# Patient Record
Sex: Female | Born: 1955 | Race: White | Hispanic: No | Marital: Married | Smoking: Never smoker
Health system: Southern US, Community
[De-identification: ages and names within clinical notes are randomized; demographics above are authoritative.]

## PROBLEM LIST (undated history)

## (undated) DIAGNOSIS — F32A Depression, unspecified: Secondary | ICD-10-CM

## (undated) DIAGNOSIS — C801 Malignant (primary) neoplasm, unspecified: Secondary | ICD-10-CM

## (undated) DIAGNOSIS — F329 Major depressive disorder, single episode, unspecified: Secondary | ICD-10-CM

## (undated) DIAGNOSIS — C4490 Unspecified malignant neoplasm of skin, unspecified: Secondary | ICD-10-CM

## (undated) HISTORY — DX: Depression, unspecified: F32.A

## (undated) HISTORY — DX: Major depressive disorder, single episode, unspecified: F32.9

## (undated) HISTORY — DX: Malignant (primary) neoplasm, unspecified: C80.1

## (undated) HISTORY — PX: APPENDECTOMY: SHX54

## (undated) HISTORY — DX: Unspecified malignant neoplasm of skin, unspecified: C44.90

---

## 1999-04-30 ENCOUNTER — Other Ambulatory Visit: Admission: RE | Admit: 1999-04-30 | Discharge: 1999-04-30 | Payer: Self-pay | Admitting: Obstetrics & Gynecology

## 1999-05-01 ENCOUNTER — Other Ambulatory Visit: Admission: RE | Admit: 1999-05-01 | Discharge: 1999-05-01 | Payer: Self-pay | Admitting: Obstetrics & Gynecology

## 2000-06-09 ENCOUNTER — Other Ambulatory Visit: Admission: RE | Admit: 2000-06-09 | Discharge: 2000-06-09 | Payer: Self-pay | Admitting: Obstetrics & Gynecology

## 2001-04-11 ENCOUNTER — Encounter: Admission: RE | Admit: 2001-04-11 | Discharge: 2001-04-11 | Payer: Self-pay | Admitting: Family Medicine

## 2001-04-11 ENCOUNTER — Encounter: Payer: Self-pay | Admitting: Family Medicine

## 2002-09-13 ENCOUNTER — Other Ambulatory Visit: Admission: RE | Admit: 2002-09-13 | Discharge: 2002-09-13 | Payer: Self-pay | Admitting: Obstetrics & Gynecology

## 2004-01-10 ENCOUNTER — Ambulatory Visit: Payer: Self-pay | Admitting: Family Medicine

## 2004-03-06 ENCOUNTER — Ambulatory Visit: Payer: Self-pay | Admitting: Family Medicine

## 2004-03-06 ENCOUNTER — Other Ambulatory Visit: Admission: RE | Admit: 2004-03-06 | Discharge: 2004-03-06 | Payer: Self-pay | Admitting: Family Medicine

## 2004-03-27 ENCOUNTER — Ambulatory Visit: Payer: Self-pay | Admitting: Family Medicine

## 2004-06-29 LAB — HM COLONOSCOPY: HM Colonoscopy: NORMAL

## 2004-07-15 ENCOUNTER — Ambulatory Visit: Payer: Self-pay | Admitting: Family Medicine

## 2005-07-20 ENCOUNTER — Ambulatory Visit: Payer: Self-pay | Admitting: Family Medicine

## 2005-07-24 ENCOUNTER — Ambulatory Visit: Payer: Self-pay | Admitting: Family Medicine

## 2005-12-22 ENCOUNTER — Ambulatory Visit: Payer: Self-pay | Admitting: Family Medicine

## 2008-05-11 ENCOUNTER — Ambulatory Visit: Payer: Self-pay | Admitting: Internal Medicine

## 2010-04-01 LAB — HM PAP SMEAR: HM Pap smear: NORMAL

## 2010-10-27 ENCOUNTER — Ambulatory Visit (INDEPENDENT_AMBULATORY_CARE_PROVIDER_SITE_OTHER): Payer: BC Managed Care – PPO | Admitting: Internal Medicine

## 2010-10-27 ENCOUNTER — Encounter: Payer: Self-pay | Admitting: Internal Medicine

## 2010-10-27 DIAGNOSIS — F32A Depression, unspecified: Secondary | ICD-10-CM | POA: Insufficient documentation

## 2010-10-27 DIAGNOSIS — L255 Unspecified contact dermatitis due to plants, except food: Secondary | ICD-10-CM

## 2010-10-27 DIAGNOSIS — M810 Age-related osteoporosis without current pathological fracture: Secondary | ICD-10-CM | POA: Insufficient documentation

## 2010-10-27 DIAGNOSIS — Z23 Encounter for immunization: Secondary | ICD-10-CM

## 2010-10-27 DIAGNOSIS — N390 Urinary tract infection, site not specified: Secondary | ICD-10-CM

## 2010-10-27 DIAGNOSIS — L237 Allergic contact dermatitis due to plants, except food: Secondary | ICD-10-CM

## 2010-10-27 DIAGNOSIS — J329 Chronic sinusitis, unspecified: Secondary | ICD-10-CM

## 2010-10-27 DIAGNOSIS — F329 Major depressive disorder, single episode, unspecified: Secondary | ICD-10-CM | POA: Insufficient documentation

## 2010-10-27 MED ORDER — CIPROFLOXACIN HCL 250 MG PO TABS: ORAL_TABLET | ORAL | Status: DC

## 2010-10-27 MED ORDER — AZITHROMYCIN 500 MG PO TABS: ORAL_TABLET | ORAL | Status: DC

## 2010-10-27 MED ORDER — METHYLPREDNISOLONE ACETATE 40 MG/ML IJ SUSP
40.0000 mg | Freq: Once | INTRAMUSCULAR | Status: AC
  Administered 2010-10-27: 40 mg via INTRAMUSCULAR

## 2010-10-27 MED ORDER — HYDROXYZINE PAMOATE 25 MG PO CAPS: 25.0000 mg | ORAL_CAPSULE | Freq: Three times a day (TID) | ORAL | Status: AC | PRN

## 2010-10-27 MED ORDER — PREDNISONE (PAK) 10 MG PO TABS: ORAL_TABLET | ORAL | Status: AC

## 2010-10-27 NOTE — Progress Notes (Signed)
  Subjective:    Patient ID: Joy Edwards, female    DOB: 1955-09-16, 55 y.o.   MRN: [id number]  HPI  55 yo wf presents with diffuse erythematous pruritic rash covering arms and legs after coming into contact with poison ivy  This weekend while clearing her mother's garden.  No fevers, chills, or open draining lesions, but intensely pruritic and spreading.   No past medical history on file. No current outpatient prescriptions on file prior to visit.   No current facility-administered medications on file prior to visit.    Review of Systems  Constitutional: Negative for fever, chills and unexpected weight change.  HENT: Negative for hearing loss, ear pain, nosebleeds, congestion, sore throat, facial swelling, rhinorrhea, sneezing, mouth sores, trouble swallowing, neck pain, neck stiffness, voice change, postnasal drip, sinus pressure, tinnitus and ear discharge.   Eyes: Negative for pain, discharge, redness and visual disturbance.  Respiratory: Negative for cough, chest tightness, shortness of breath, wheezing and stridor.   Cardiovascular: Negative for chest pain, palpitations and leg swelling.  Musculoskeletal: Negative for myalgias and arthralgias.  Skin: Positive for rash. Negative for color change.  Neurological: Negative for dizziness, weakness, light-headedness and headaches.  Hematological: Negative for adenopathy.       Objective:   Physical Exam  Constitutional: She is oriented to person, place, and time. She appears well-developed and well-nourished.  HENT:  Mouth/Throat: Oropharynx is clear and moist.  Eyes: EOM are normal. Pupils are equal, round, and reactive to light. No scleral icterus.  Neck: Normal range of motion. Neck supple. No JVD present. No thyromegaly present.  Cardiovascular: Normal rate, regular rhythm, normal heart sounds and intact distal pulses.   Pulmonary/Chest: Effort normal and breath sounds normal.  Abdominal: Soft. Bowel sounds are normal. She  exhibits no mass. There is no tenderness.  Musculoskeletal: Normal range of motion. She exhibits no edema.  Lymphadenopathy:    She has no cervical adenopathy.  Neurological: She is alert and oriented to person, place, and time.  Skin: Skin is warm and dry. Rash noted.  Psychiatric: She has a normal mood and affect.          Assessment & Plan:  Contact dermatitis: secondary to poison ivy/sumac exposure this weekend. Hiistory of moderate to severe systemic reactions in the past.  IM Depo medrol given and steroid taper.   Influenza prophylaxis: she has no prior history of annual vaccinations, but she is travelling to [place] in 3 weeks and I have recommended vaccination.  Prophylaxis for traveler's diarrhea and CAP:  I have given her prescriptions for ciprofloxacin and azithromycin to take with her to [place] .

## 2011-11-09 ENCOUNTER — Encounter: Payer: Self-pay | Admitting: Internal Medicine

## 2012-04-27 ENCOUNTER — Telehealth: Payer: Self-pay | Admitting: Internal Medicine

## 2012-04-27 DIAGNOSIS — M810 Age-related osteoporosis without current pathological fracture: Secondary | ICD-10-CM

## 2012-04-27 NOTE — Telephone Encounter (Signed)
Bone density scheduled 3/31, they need the order Fx [phone]

## 2012-04-27 NOTE — Telephone Encounter (Signed)
Ordered.  To be done at [facility], I presume

## 2012-04-29 LAB — HM DEXA SCAN

## 2012-05-16 ENCOUNTER — Encounter: Payer: BC Managed Care – PPO | Admitting: Internal Medicine

## 2012-06-01 ENCOUNTER — Encounter: Payer: Self-pay | Admitting: Internal Medicine

## 2012-06-29 ENCOUNTER — Other Ambulatory Visit (HOSPITAL_COMMUNITY)
Admission: RE | Admit: 2012-06-29 | Discharge: 2012-06-29 | Disposition: A | Discharge status: Home / Self Care | Payer: BC Managed Care – PPO | Source: Ambulatory Visit | Attending: Internal Medicine | Admitting: Internal Medicine

## 2012-06-29 ENCOUNTER — Encounter: Payer: Self-pay | Admitting: Internal Medicine

## 2012-06-29 ENCOUNTER — Ambulatory Visit (INDEPENDENT_AMBULATORY_CARE_PROVIDER_SITE_OTHER): Payer: BC Managed Care – PPO | Admitting: Internal Medicine

## 2012-06-29 VITALS — BP 108/72 | HR 71 | Temp 98.2°F | Resp 14 | Ht 64.0 in | Wt 118.8 lb

## 2012-06-29 DIAGNOSIS — F329 Major depressive disorder, single episode, unspecified: Secondary | ICD-10-CM

## 2012-06-29 DIAGNOSIS — Z01419 Encounter for gynecological examination (general) (routine) without abnormal findings: Secondary | ICD-10-CM | POA: Insufficient documentation

## 2012-06-29 DIAGNOSIS — F489 Nonpsychotic mental disorder, unspecified: Secondary | ICD-10-CM

## 2012-06-29 DIAGNOSIS — Z1151 Encounter for screening for human papillomavirus (HPV): Secondary | ICD-10-CM | POA: Insufficient documentation

## 2012-06-29 DIAGNOSIS — F341 Dysthymic disorder: Secondary | ICD-10-CM

## 2012-06-29 DIAGNOSIS — F418 Other specified anxiety disorders: Secondary | ICD-10-CM

## 2012-06-29 DIAGNOSIS — Z124 Encounter for screening for malignant neoplasm of cervix: Secondary | ICD-10-CM

## 2012-06-29 DIAGNOSIS — F5105 Insomnia due to other mental disorder: Secondary | ICD-10-CM

## 2012-06-29 DIAGNOSIS — F32A Depression, unspecified: Secondary | ICD-10-CM

## 2012-06-29 DIAGNOSIS — Z1239 Encounter for other screening for malignant neoplasm of breast: Secondary | ICD-10-CM

## 2012-06-29 DIAGNOSIS — Z0000 Encounter for general adult medical examination without abnormal findings: Secondary | ICD-10-CM

## 2012-06-29 MED ORDER — ZOLPIDEM TARTRATE 5 MG PO TABS: 5.0000 mg | ORAL_TABLET | Freq: Every evening | ORAL | Status: DC | PRN

## 2012-06-29 NOTE — Patient Instructions (Addendum)
We will investigate for causes of bruising including vitamin K deficiency. Sand vitamin D   Do the take home stool test annual for colon Ca screening   Request a 3 d mammogram with next mammogram .   I have given you samples of Premarin vaginal cream..  Apply intravaginally every night for two weeks,  Then reduce applications to twice weekly.  Use  1  Gram per application   For your insomnia,  Trial of low dose generic ambien .  Start with 1/2 tablet ,  Repeat dose in 30 minutes if no results. Use no more than 3 times per week to avoid dependency

## 2012-06-29 NOTE — Progress Notes (Signed)
Patient ID: Joy Edwards, female   DOB: [birth year], 57 y.o.   MRN: [id number]      Subjective:     Joy Edwards is a 57 y.o. female and is here for a comprehensive physical exam. The patient reports problems - with chronic insomnia .  History   Social History  . Marital Status: Married    Spouse Name: N/A    Number of Children: N/A  . Years of Education: N/A   Occupational History  . Not on file.   Social History Main Topics  . Smoking status: Never Smoker   . Smokeless tobacco: Never Used  . Alcohol Use: Yes     Comment: wine  . Drug Use: No  . Sexually Active: Not on file   Other Topics Concern  . Not on file   Social History Narrative  . No narrative on file   Health Maintenance  Topic Date Due  . Tetanus/tdap  06/27/1972  . Mammogram  06/28/2003  . Influenza Vaccine  10/03/2012  . Colonoscopy  06/30/2014  . Pap Smear  06/30/2015    The following portions of the patient's history were reviewed and updated as appropriate: allergies, current medications, past family history, past medical history, past social history, past surgical history and problem list.  Review of Systems A comprehensive review of systems was negative.   Objective:   BP 108/72  Pulse 71  Temp(Src) 98.2 F (36.8 C) (Oral)  Resp 14  Ht 5\' 4"  (1.626 m)  Wt 118 lb 12 oz (53.865 kg)  BMI 20.37 kg/m2  SpO2 99%  General Appearance:    Alert, cooperative, no distress, appears stated age  Head:    Normocephalic, without obvious abnormality, atraumatic  Eyes:    PERRL, conjunctiva/corneas clear, EOM's intact, fundi    benign, both eyes  Ears:    Normal TM's and external ear canals, both ears  Nose:   Nares normal, septum midline, mucosa normal, no drainage    or sinus tenderness  Throat:   Lips, mucosa, and tongue normal; teeth and gums normal  Neck:   Supple, symmetrical, trachea midline, no adenopathy;    thyroid:  no enlargement/tenderness/nodules; no carotid   bruit or JVD   Back:     Symmetric, no curvature, ROM normal, no CVA tenderness  Lungs:     Clear to auscultation bilaterally, respirations unlabored  Chest Wall:    No tenderness or deformity   Heart:    Regular rate and rhythm, S1 and S2 normal, no murmur, rub   or gallop  Breast Exam:    No tenderness, masses, or nipple abnormality  Abdomen:     Soft, non-tender, bowel sounds active all four quadrants,    no masses, no organomegaly  Genitalia:    Pelvic: cervix normal in appearance, external genitalia normal, no adnexal masses or tenderness, no cervical motion tenderness, rectovaginal septum normal, uterus normal size, shape, and consistency and vagina normal without discharge  Extremities:   Extremities normal, atraumatic, no cyanosis or edema  Pulses:   2+ and symmetric all extremities  Skin:   Skin color, texture, turgor normal, no rashes or lesions  Lymph nodes:   Cervical, supraclavicular, and axillary nodes normal  Neurologic:   CNII-XII intact, normal strength, sensation and reflexes    throughout     Assessment:    Routine general medical examination at a health care facility Annual comprehensive exam was done including breast, pelvic and PAP smear. All screenings have been  addressed .   Depression With insomnia persistent despite use of wellbutrin and sertraline   Insomnia secondary to depression with anxiety Discussed trial of low dose ambien not to be used more than 3 times weekly   Updated Medication List Outpatient Encounter Prescriptions as of 06/29/2012  Medication Sig Dispense Refill  . buPROPion (WELLBUTRIN XL) 300 MG 24 hr tablet Take 300 mg by mouth daily.        . sertraline (ZOLOFT) 25 MG tablet Take 25 mg by mouth daily.        Marland Kitchen azithromycin (ZITHROMAX) 500 MG tablet Take 1 tablet daily for 7 days for sinusitis or pneumonia  7 tablet  0  . ciprofloxacin (CIPRO) 250 MG tablet 1 tablet twice daily for UTI or diarrhea  14 tablet  7  . zolpidem (AMBIEN) 5 MG tablet Take 1  tablet (5 mg total) by mouth at bedtime as needed for sleep.  15 tablet  1  . [DISCONTINUED] zolpidem (AMBIEN) 5 MG tablet Take 1 tablet (5 mg total) by mouth at bedtime as needed for sleep.  15 tablet  1   No facility-administered encounter medications on file as of 06/29/2012.

## 2012-06-30 ENCOUNTER — Encounter: Payer: Self-pay | Admitting: Internal Medicine

## 2012-06-30 DIAGNOSIS — F418 Other specified anxiety disorders: Secondary | ICD-10-CM | POA: Insufficient documentation

## 2012-06-30 DIAGNOSIS — Z0000 Encounter for general adult medical examination without abnormal findings: Secondary | ICD-10-CM | POA: Insufficient documentation

## 2012-06-30 NOTE — Assessment & Plan Note (Signed)
Discussed trial of low dose ambien not to be used more than 3 times weekly

## 2012-06-30 NOTE — Assessment & Plan Note (Addendum)
With insomnia persistent despite use of wellbutrin and sertraline

## 2012-06-30 NOTE — Assessment & Plan Note (Signed)
Annual comprehensive exam was done including breast, pelvic and PAP smear. All screenings have been addressed .  

## 2012-07-05 ENCOUNTER — Encounter: Payer: Self-pay | Admitting: *Deleted

## 2012-07-13 ENCOUNTER — Telehealth: Payer: Self-pay | Admitting: *Deleted

## 2012-07-13 DIAGNOSIS — E785 Hyperlipidemia, unspecified: Secondary | ICD-10-CM

## 2012-07-13 DIAGNOSIS — R5381 Other malaise: Secondary | ICD-10-CM

## 2012-07-13 DIAGNOSIS — E559 Vitamin D deficiency, unspecified: Secondary | ICD-10-CM

## 2012-07-13 NOTE — Telephone Encounter (Signed)
Pt is coming in for labs Friday 06.13.2014 what labs and dx?  

## 2012-07-15 ENCOUNTER — Encounter: Payer: Self-pay | Admitting: *Deleted

## 2012-07-15 ENCOUNTER — Other Ambulatory Visit (INDEPENDENT_AMBULATORY_CARE_PROVIDER_SITE_OTHER): Payer: BC Managed Care – PPO

## 2012-07-15 DIAGNOSIS — R5381 Other malaise: Secondary | ICD-10-CM

## 2012-07-15 DIAGNOSIS — E785 Hyperlipidemia, unspecified: Secondary | ICD-10-CM

## 2012-07-15 DIAGNOSIS — E559 Vitamin D deficiency, unspecified: Secondary | ICD-10-CM

## 2012-07-15 LAB — COMPREHENSIVE METABOLIC PANEL
ALT: 26 U/L (ref 0–35)
AST: 22 U/L (ref 0–37)
Albumin: 4 g/dL (ref 3.5–5.2)
Alkaline Phosphatase: 52 U/L (ref 39–117)
BUN: 15 mg/dL (ref 6–23)
CO2: 27 mEq/L (ref 19–32)
Calcium: 9.2 mg/dL (ref 8.4–10.5)
Chloride: 104 mEq/L (ref 96–112)
Creatinine, Ser: 0.8 mg/dL (ref 0.4–1.2)
GFR: 74.77 mL/min (ref >60.00)
Glucose, Bld: 88 mg/dL (ref 70–99)
Potassium: 4.3 mEq/L (ref 3.5–5.1)
Sodium: 139 mEq/L (ref 135–145)
Total Bilirubin: 0.7 mg/dL (ref 0.3–1.2)
Total Protein: 6.7 g/dL (ref 6.0–8.3)

## 2012-07-15 LAB — CBC WITH DIFFERENTIAL/PLATELET
Basophils Absolute: 0 10*3/uL (ref 0.0–0.1)
Basophils Relative: 1 % (ref 0.0–3.0)
Eosinophils Absolute: 0.1 10*3/uL (ref 0.0–0.7)
Eosinophils Relative: 3 % (ref 0.0–5.0)
HCT: 39.7 % (ref 36.0–46.0)
Hemoglobin: 13.6 g/dL (ref 12.0–15.0)
Lymphocytes Relative: 32.8 % (ref 12.0–46.0)
Lymphs Abs: 1.2 10*3/uL (ref 0.7–4.0)
MCHC: 34.3 g/dL (ref 30.0–36.0)
MCV: 91.1 fl (ref 78.0–100.0)
Monocytes Absolute: 0.4 10*3/uL (ref 0.1–1.0)
Monocytes Relative: 11.2 % (ref 3.0–12.0)
Neutro Abs: 1.9 10*3/uL (ref 1.4–7.7)
Neutrophils Relative %: 52 % (ref 43.0–77.0)
Platelets: 202 10*3/uL (ref 150.0–400.0)
RBC: 4.36 Mil/uL (ref 3.87–5.11)
RDW: 12.6 % (ref 11.5–14.6)
WBC: 3.7 10*3/uL — ABNORMAL LOW (ref 4.5–10.5)

## 2012-07-15 LAB — LIPID PANEL
Cholesterol: 183 mg/dL (ref 0–200)
HDL: 81.6 mg/dL (ref >39.00)
LDL Cholesterol: 92 mg/dL (ref 0–99)
Total CHOL/HDL Ratio: 2
Triglycerides: 48 mg/dL (ref 0.0–149.0)
VLDL: 9.6 mg/dL (ref 0.0–40.0)

## 2012-07-15 LAB — TSH: TSH: 2.68 u[IU]/mL (ref 0.35–5.50)

## 2012-07-16 LAB — VITAMIN D 25 HYDROXY (VIT D DEFICIENCY, FRACTURES): Vit D, 25-Hydroxy: 48 ng/mL (ref 30–89)

## 2012-08-19 ENCOUNTER — Other Ambulatory Visit: Payer: Self-pay | Admitting: Chiropractic Medicine

## 2012-08-19 ENCOUNTER — Ambulatory Visit
Admission: RE | Admit: 2012-08-19 | Discharge: 2012-08-19 | Disposition: A | Discharge status: Home / Self Care | Payer: BC Managed Care – PPO | Source: Ambulatory Visit | Attending: Chiropractic Medicine | Admitting: Chiropractic Medicine

## 2012-08-19 DIAGNOSIS — M545 Low back pain, unspecified: Secondary | ICD-10-CM

## 2012-08-19 DIAGNOSIS — M542 Cervicalgia: Secondary | ICD-10-CM

## 2012-08-23 ENCOUNTER — Other Ambulatory Visit: Payer: Self-pay | Admitting: Physician Assistant

## 2012-08-23 ENCOUNTER — Ambulatory Visit
Admission: RE | Admit: 2012-08-23 | Discharge: 2012-08-23 | Disposition: A | Discharge status: Home / Self Care | Payer: BC Managed Care – PPO | Source: Ambulatory Visit | Attending: Physician Assistant | Admitting: Physician Assistant

## 2012-08-23 DIAGNOSIS — M953 Acquired deformity of neck: Secondary | ICD-10-CM

## 2012-08-23 DIAGNOSIS — IMO0002 Reserved for concepts with insufficient information to code with codable children: Secondary | ICD-10-CM

## 2012-08-23 DIAGNOSIS — M4310 Spondylolisthesis, site unspecified: Secondary | ICD-10-CM

## 2012-08-26 ENCOUNTER — Telehealth: Payer: Self-pay | Admitting: *Deleted

## 2012-08-26 NOTE — Telephone Encounter (Signed)
Order for DG Cerv spine & flex ext ordered under wrong Tereso Newcomer, PAC. I s/w with Elease Hashimoto @ [facility] who placed the order and asked for them to please correct order remving Jocelyn Lamer, PA and place under [place] D. Saralyn Pilar.  ok thank you

## 2012-12-08 ENCOUNTER — Other Ambulatory Visit: Payer: Self-pay

## 2012-12-15 ENCOUNTER — Ambulatory Visit (INDEPENDENT_AMBULATORY_CARE_PROVIDER_SITE_OTHER): Payer: BC Managed Care – PPO | Admitting: Adult Health

## 2012-12-15 ENCOUNTER — Encounter: Payer: Self-pay | Admitting: Adult Health

## 2012-12-15 VITALS — BP 122/76 | HR 125 | Temp 97.6°F | Resp 16 | Wt 121.0 lb

## 2012-12-15 DIAGNOSIS — J0100 Acute maxillary sinusitis, unspecified: Secondary | ICD-10-CM | POA: Insufficient documentation

## 2012-12-15 DIAGNOSIS — J329 Chronic sinusitis, unspecified: Secondary | ICD-10-CM

## 2012-12-15 MED ORDER — AZITHROMYCIN 250 MG PO TABS: ORAL_TABLET | ORAL | Status: DC

## 2012-12-15 MED ORDER — FLUTICASONE PROPIONATE 50 MCG/ACT NA SUSP: 2.0000 | Freq: Every day | NASAL | Status: DC

## 2012-12-15 NOTE — Progress Notes (Signed)
  Subjective:    Patient ID: Joy Edwards, female    DOB: 08/31/1955, 57 y.o.   MRN: [id number]  HPI Patient is a pleasant 57 year old female who presents to clinic with sinus congestion, pressure, bilateral ear pressure, stuffy head x3 days. She reports green secretions. Postnasal drip. No cough, shortness of breath. Afebrile   Current Outpatient Prescriptions on File Prior to Visit  Medication Sig Dispense Refill  . buPROPion (WELLBUTRIN XL) 300 MG 24 hr tablet Take 300 mg by mouth daily.        . sertraline (ZOLOFT) 25 MG tablet Take 25 mg by mouth daily.        . ciprofloxacin (CIPRO) 250 MG tablet 1 tablet twice daily for UTI or diarrhea  14 tablet  7  . zolpidem (AMBIEN) 5 MG tablet Take 1 tablet (5 mg total) by mouth at bedtime as needed for sleep.  15 tablet  1   No current facility-administered medications on file prior to visit.    Review of Systems  Constitutional: Negative for fever and chills.  HENT: Positive for congestion, postnasal drip, rhinorrhea and sinus pressure. Negative for sore throat.        Objective:   Physical Exam  Constitutional: She is oriented to person, place, and time. She appears well-developed and well-nourished.  HENT:  Head: Normocephalic and atraumatic.  Right Ear: External ear normal.  Left Ear: External ear normal.  Thick drainage posterior pharynx. Erythema of throat.  Cardiovascular: Normal rate and regular rhythm.   Pulmonary/Chest: Effort normal. No respiratory distress. She has no wheezes. She has no rales.  Neurological: She is alert and oriented to person, place, and time.  Psychiatric: She has a normal mood and affect. Her behavior is normal. Judgment and thought content normal.          Assessment & Plan:

## 2012-12-15 NOTE — Assessment & Plan Note (Signed)
Start azithromycin. Flonase nasal spray 2 sprays in each nostril daily. Irrigate with saline. Drink plenty of fluids.

## 2012-12-15 NOTE — Patient Instructions (Signed)

## 2012-12-15 NOTE — Progress Notes (Signed)
Pre visit review using our clinic review tool, if applicable. No additional management support is needed unless otherwise documented below in the visit note. 

## 2013-03-17 ENCOUNTER — Other Ambulatory Visit: Payer: Self-pay | Admitting: Internal Medicine

## 2013-03-17 ENCOUNTER — Telehealth: Payer: Self-pay | Admitting: Internal Medicine

## 2013-03-17 DIAGNOSIS — H6690 Otitis media, unspecified, unspecified ear: Secondary | ICD-10-CM

## 2013-03-17 MED ORDER — AMOXICILLIN-POT CLAVULANATE 875-125 MG PO TABS: 1.0000 | ORAL_TABLET | Freq: Two times a day (BID) | ORAL | Status: DC

## 2013-03-17 NOTE — Telephone Encounter (Signed)
Patient to go to urgent care.

## 2013-03-17 NOTE — Telephone Encounter (Signed)
Pt left vm needing appt for ?ear infection.  Returned pt call.  No appt available at this office.  Offered pt appt at [facility].  Pt willing, but could not make appt at [facility] due to schedule conflict.  Please call pt with any openings this afternoon if possible.  Pt will go to urgent care if she has to.

## 2013-03-17 NOTE — Assessment & Plan Note (Signed)
Augmentin, sudafed PE and probiotics advised

## 2013-06-06 ENCOUNTER — Other Ambulatory Visit: Payer: Self-pay | Admitting: Chiropractic Medicine

## 2013-06-06 DIAGNOSIS — M4310 Spondylolisthesis, site unspecified: Secondary | ICD-10-CM

## 2013-06-06 DIAGNOSIS — IMO0002 Reserved for concepts with insufficient information to code with codable children: Secondary | ICD-10-CM

## 2013-06-06 DIAGNOSIS — M953 Acquired deformity of neck: Secondary | ICD-10-CM

## 2013-11-17 ENCOUNTER — Other Ambulatory Visit: Payer: Self-pay

## 2013-12-15 ENCOUNTER — Ambulatory Visit (INDEPENDENT_AMBULATORY_CARE_PROVIDER_SITE_OTHER): Payer: BC Managed Care – PPO | Admitting: Podiatry

## 2013-12-15 ENCOUNTER — Encounter: Payer: Self-pay | Admitting: Podiatry

## 2013-12-15 ENCOUNTER — Ambulatory Visit (INDEPENDENT_AMBULATORY_CARE_PROVIDER_SITE_OTHER): Payer: BC Managed Care – PPO

## 2013-12-15 DIAGNOSIS — M79674 Pain in right toe(s): Secondary | ICD-10-CM

## 2013-12-15 DIAGNOSIS — S92911A Unspecified fracture of right toe(s), initial encounter for closed fracture: Secondary | ICD-10-CM

## 2013-12-15 NOTE — Patient Instructions (Signed)

## 2013-12-15 NOTE — Progress Notes (Signed)
   Subjective:    Patient ID: Joy Edwards, female    DOB: 31-Dec-1955, 58 y.o.   MRN: [id number]  HPI 79-year-old female presents the operative complaints of right fourth toe pain. She states that she injured approximately one month ago and since then she's had continued pain and swelling and bruising over the digit. At the time of injury for which she states she stubbed her toe, she did not have any treatment. She states that she has pain particularly with weightbearing certain shoe gear. Denies any other injury or areas of pain. No other complaints at this time.   Review of Systems  HENT: Positive for tinnitus.   Endocrine: Positive for cold intolerance.  Hematological: Bruises/bleeds easily.  All other systems reviewed and are negative.      Objective:   Physical Exam Objective: AAO x3, NAD DP/PT pulses palpable bilaterally, CRT less than 3 seconds Protective sensation intact with Simms Weinstein monofilament, vibratory sensation intact, Achilles tendon reflex intact Tenderness palpation of the right fourth digit. There is mild edema overlying the digit with mild ecchymosis. There is mild pain with range of motion of the PIPJ and MTPJ. There is no pain along the course of the metatarsal and no pain with vibratory sensation along the metatarsal. There is no edema overlying the forefoot. There is no other areas of pinpoint pain or pain with vibratory sensation. MMT 5/5, ROM WNL except for otherwise mentioned. No calf pain with compression, swelling, warmth, erythema.  No open lesions/preulcerative lesions       Assessment & Plan:  58 year old female right fourth toe fracture. -X-rays were obtained and reviewed with the patient. -Conservative versus surgical treatment discussed including alternatives, risks, complications. -At this time due to continued pain into the digit and pain with range of motion of MTPJ and fracture identified on x-ray well placed patient into a surgical shoe  to help take the pressure off the area and to help prevent bending of the toe.  -Ice to the area. -Follow-up in 3 weeks or sooner if any problems are to arise. At that time repeat x-rays will be obtained. In the meantime, call the office in the questions, concerns, change in symptoms. Follow-up with PCP for other issues mentioned in the ROS.

## 2014-01-05 ENCOUNTER — Ambulatory Visit (INDEPENDENT_AMBULATORY_CARE_PROVIDER_SITE_OTHER): Payer: BC Managed Care – PPO | Admitting: Podiatry

## 2014-01-05 ENCOUNTER — Ambulatory Visit (INDEPENDENT_AMBULATORY_CARE_PROVIDER_SITE_OTHER): Payer: BC Managed Care – PPO

## 2014-01-05 ENCOUNTER — Encounter: Payer: Self-pay | Admitting: Podiatry

## 2014-01-05 VITALS — BP 118/67 | HR 62 | Resp 11

## 2014-01-05 DIAGNOSIS — M79671 Pain in right foot: Secondary | ICD-10-CM

## 2014-01-05 DIAGNOSIS — S92911D Unspecified fracture of right toe(s), subsequent encounter for fracture with routine healing: Secondary | ICD-10-CM

## 2014-01-09 NOTE — Progress Notes (Signed)
Patient ID: NDEA KILROY, female   DOB: Jun 29, 1955, 58 y.o.   MRN: [id number]  Subjective: 58 year old female returns the office they for follow-up evaluation of right fourth digit fracture. She states that since last appointment she has continued to have discomfort the digit as well as swelling. She has been wearing the surgical shoe. She denies any acute changes since last appointment. Denies any systemic complaints such as fevers, chills, nausea, vomiting. No other complaints at this time.  Objective: AAO 3, NAD DP/PT pulses palpable bilaterally, CRT less than 3 seconds Protective sensation intact with Simms Weinstein monofilament, vibratory sensation intact, Achilles tendon reflex intact. There is tenderness to palpation overlying the central aspect of the right fourth digit. There is overlying edema to the fourth digit. There is no pain with range of motion of MTPJ or along the course of the metatarsals. Mild discomfort with ROM of the right 4th PIPJ/DIPJ. There is no open lesions. No overlying erythema or increase in warmth. No other areas of pinpoint bony tenderness or pain with vibratory sensation. Overall, MMT 5/5, ROM WNL except otherwise stated. No pain with calf compression, swelling, warmth, erythema. No open lesions or pre-ulcerative lesions.  Assessment: 58 year old female right fourth digit fracture  Plan: -X-rays were obtained and reviewed the patient. There still appears to be radiolucency consistent with fracture in the fourth digit. Patient has been immobilized now for 4 weeks. -Continue wearing surgical shoe. Discussed with the patient and showed her how to splint the fourth digit to the third digit and to help control swelling. -Continue ice to the area. -Follow-up in 3 weeks or sooner if any problems are to arise. In the meantime, call the office with any questions, concerns, change in symptoms.

## 2014-01-31 ENCOUNTER — Ambulatory Visit (INDEPENDENT_AMBULATORY_CARE_PROVIDER_SITE_OTHER): Payer: BC Managed Care – PPO | Admitting: Podiatry

## 2014-01-31 ENCOUNTER — Encounter: Payer: Self-pay | Admitting: Podiatry

## 2014-01-31 ENCOUNTER — Ambulatory Visit (INDEPENDENT_AMBULATORY_CARE_PROVIDER_SITE_OTHER): Payer: BC Managed Care – PPO

## 2014-01-31 VITALS — HR 73 | Resp 18

## 2014-01-31 DIAGNOSIS — R52 Pain, unspecified: Secondary | ICD-10-CM

## 2014-01-31 DIAGNOSIS — S92911D Unspecified fracture of right toe(s), subsequent encounter for fracture with routine healing: Secondary | ICD-10-CM

## 2014-02-02 NOTE — Progress Notes (Signed)
Patient ID: Joy Edwards, female   DOB: 1955-02-08, 59 y.o.   MRN: [id number]  Subjective: 59 year old female returns the office for follow up evaluation of right fourth digit fracture. She states that she's been continuing with the surgical shoe. She states that the area is that is painful as it was prior however she states that when she is on it more she has pain and swelling to the toe. She denies any recent injury or trauma to the area. No other complaints at this time in no acute changes since last appointment. Denies any systemic complaints such as fevers, chills, nausea, vomiting.  Objective: AAO x3, NAD DP/PT pulses palpable bilaterally, CRT less than 3 seconds Protective sensation intact with Simms Weinstein monofilament, vibratory sensation intact, Achilles tendon reflex intact Tenderness palpation overlying the right fourth digit. There is mild edema around the digit. There is no other areas of pinpoint bony tenderness or pain with vibratory sensation to bilateral lower extremity's. Edema is limited to the digit and does not extend past the level of the MPJ. There is no associated erythema or increase in warmth. MTPJ range of motion within normal limits and without discomfort. MMT 5/5, ROM WNL No open lesions or pre-ulcerative lesions. No pain with calf compression, swelling, warmth, erythema.  Assessment: 59 year old female follow up evaluation right fourth digital fracture.  Plan: -X-rays were obtained and reviewed with the patient. There continues to be radiolucent line within the fourth digit. -Treatment options were discussed including alternatives, risks, competitions. -Discussed with the patient that although the fracture is not healed on x-ray she can start to transition to a regular sneaker as tolerated as her symptoms decrease. Continue to buddy tape the digits together for stabilization and to help control swelling. If she continues to have pain/swelling to the digits or  any increase when wearing a sneaker, to go back into the surgical shoe.  -Follow-up in 4 weeks or sooner should any problems arise. In the meantime, encouraged to call with any questions/concerns/change in symptoms.

## 2014-02-12 ENCOUNTER — Telehealth: Payer: Self-pay | Admitting: Internal Medicine

## 2014-02-12 NOTE — Telephone Encounter (Signed)
Spoke with pt, she states she was at work when symptoms started.  She is on the way home now, and she states she is going to lay down for a while.  If she still does not feel better she will have someone take her to the ER.

## 2014-02-12 NOTE — Telephone Encounter (Signed)
[facility] called and stated patient having symptoms of syncope and generalized weakness refusing ER wanted to be seen. Spoke with patient and advised MD out of office and no appointment available and that with these symptoms she should follow advice and be seen in ER. Patient stated she was in car and almost home and would have mother drive her to ER. FYI

## 2014-02-12 NOTE — Telephone Encounter (Signed)
Agree with ER eval.  Should not drive if syncope.  Can f/u after ER visit.

## 2014-02-12 NOTE — Telephone Encounter (Signed)
Pt aware. Verbalized understanding.  

## 2014-02-12 NOTE — Telephone Encounter (Signed)
[facility]  Patient Name: Joy Edwards  DOB: [birth year]    Nurse Assessment  Nurse: Loletta Specter, RN, Wells Guiles Date/Time Eilene Ghazi Time): 02/12/2014 2:19:01 PM  Confirm and document reason for call. If symptomatic, describe symptoms. ---Caller states she got really dizzy today. Still feels "not right."  Has the patient traveled out of the country within the last 30 days? ---Not Applicable  Does the patient require triage? ---Yes  Related visit to physician within the last 2 weeks? ---No  Does the PT have any chronic conditions? (i.e. diabetes, asthma, etc.) ---No     Guidelines    Guideline Title Affirmed Question Affirmed Notes  Dizziness - Lightheadedness Patient sounds very sick or weak to the triager    Final Disposition User   Go to ED Now (or PCP triage) Loletta Specter, RN, Wells Guiles

## 2014-02-12 NOTE — Telephone Encounter (Signed)
Agree with plan. If not feeling better, or if other symptoms such as chest pain, dyspnea, then needs to be evaluated in the ED asap.

## 2014-02-13 NOTE — Telephone Encounter (Signed)
Tried to call patient and verify if patient went to the ER there was no answer left message for patient to return call.

## 2014-02-13 NOTE — Telephone Encounter (Signed)
Patient did not go to ER as advised and now this morning is vomiting, dizziness, chills, sweats, and generalized malaise advised patient should still be evaluated no appointments available in office advised patient she should be seen either urgent care or ER. Patient stated she will GO to walk-in  Clinic or urgent care.

## 2014-02-28 ENCOUNTER — Ambulatory Visit (INDEPENDENT_AMBULATORY_CARE_PROVIDER_SITE_OTHER): Payer: BLUE CROSS/BLUE SHIELD

## 2014-02-28 ENCOUNTER — Ambulatory Visit (INDEPENDENT_AMBULATORY_CARE_PROVIDER_SITE_OTHER): Payer: BLUE CROSS/BLUE SHIELD | Admitting: Podiatry

## 2014-02-28 ENCOUNTER — Encounter: Payer: Self-pay | Admitting: Podiatry

## 2014-02-28 VITALS — BP 121/72 | HR 68 | Resp 10

## 2014-02-28 DIAGNOSIS — M79674 Pain in right toe(s): Secondary | ICD-10-CM

## 2014-02-28 DIAGNOSIS — S92911D Unspecified fracture of right toe(s), subsequent encounter for fracture with routine healing: Secondary | ICD-10-CM

## 2014-03-01 NOTE — Progress Notes (Signed)
Patient ID: NEILANI DUFFEE, female   DOB: Dec 22, 1955, 59 y.o.   MRN: [id number]  Subjective: 59 year old female returns the office for follow up evaluation of right fourth digital fracture. Since last appointment she states that she has stopped wearing a surgical shoe and she is wearing regular shoes. She presents today in a boot with a high heel. She states that she is also gone back to exercising. She states that her pain has somewhat decreased although after activity she does continue to have pain to the right fourth toe. No other complaints at this time. No acute changes since last appointment.  Objective: AAO x3, NAD DP/PT pulses palpable bilaterally, CRT less than 3 seconds Protective sensation intact with Simms Weinstein monofilament, vibratory sensation intact, Achilles tendon reflex intact Mild tenderness to palpation overlying the right fourth digit. There is mild edema around the digit however does appear to be decreased compared to prior appointment. There is no specific area pinpoint bony tenderness at this time and the pain does seem to be decreased compared to prior appointments. There is no pain with MTPJ range of motion is no pain along the metatarsals. There is no other areas of edema, erythema, increase in warmth lateral lower extremities.  No open lesions or pre-ulcerative lesions. No pain with calf compression, swelling, warmth, erythema.  Assessment: 59 year old female father vibration right fourth digital fracture  Plan: -X-rays were obtained and reviewed the patient. There does appear to be increased consolidation across the fracture site.  -Treatment options were discussed including alternatives, risks, complications. -Recommended the patient to at least wear a sneaker or supportive-type shoe which would avoid putting too much pressure on the toe. Also discussed tocontinue to tape the toe as needed. -Follow-up in 4 weeks or sooner should any problems arise. In the meantime  occurs call the office with any questions, concerns, change in symptoms.

## 2014-03-27 ENCOUNTER — Ambulatory Visit (INDEPENDENT_AMBULATORY_CARE_PROVIDER_SITE_OTHER): Payer: BLUE CROSS/BLUE SHIELD | Admitting: Internal Medicine

## 2014-03-27 ENCOUNTER — Encounter: Payer: Self-pay | Admitting: Internal Medicine

## 2014-03-27 VITALS — BP 100/62 | HR 71 | Temp 97.9°F | Resp 14 | Ht 65.0 in | Wt 122.5 lb

## 2014-03-27 DIAGNOSIS — M810 Age-related osteoporosis without current pathological fracture: Secondary | ICD-10-CM

## 2014-03-27 DIAGNOSIS — R5383 Other fatigue: Secondary | ICD-10-CM

## 2014-03-27 DIAGNOSIS — Z0000 Encounter for general adult medical examination without abnormal findings: Secondary | ICD-10-CM

## 2014-03-27 DIAGNOSIS — E785 Hyperlipidemia, unspecified: Secondary | ICD-10-CM

## 2014-03-27 DIAGNOSIS — Z1239 Encounter for other screening for malignant neoplasm of breast: Secondary | ICD-10-CM

## 2014-03-27 DIAGNOSIS — Z1159 Encounter for screening for other viral diseases: Secondary | ICD-10-CM

## 2014-03-27 DIAGNOSIS — E559 Vitamin D deficiency, unspecified: Secondary | ICD-10-CM

## 2014-03-27 DIAGNOSIS — Z1211 Encounter for screening for malignant neoplasm of colon: Secondary | ICD-10-CM

## 2014-03-27 DIAGNOSIS — Z23 Encounter for immunization: Secondary | ICD-10-CM

## 2014-03-27 DIAGNOSIS — R635 Abnormal weight gain: Secondary | ICD-10-CM

## 2014-03-27 MED ORDER — ACYCLOVIR 400 MG PO TABS: 400.0000 mg | ORAL_TABLET | Freq: Every day | ORAL | Status: DC

## 2014-03-27 NOTE — Progress Notes (Signed)
Pre-visit discussion using our clinic review tool. No additional management support is needed unless otherwise documented below in the visit note.  

## 2014-03-27 NOTE — Progress Notes (Signed)
Patient ID: NAZARET CHEA, female   DOB: 09-05-1955, 59 y.o.   MRN: [id number]   Subjective:     CATHALEEN KOROL is a 59 y.o. female and is here for a comprehensive physical exam. The patient reports no problems.  Had a prolonged episode of vertigo lasting 6 weeks ,  Sent to Urgent care vs [facility] . Eventually saw ENT. Did the  Epley procedure every night  ,  After 5 or 6 weeks it resolved,    October, broke bones in right foot in a boot for 12 weeks,  Not healing ,  Not exercising,  Finally started to heal . [facility]      History   Social History  . Marital Status: Married    Spouse Name: N/A  . Number of Children: N/A  . Years of Education: N/A   Occupational History  . Not on file.   Social History Main Topics  . Smoking status: Never Smoker   . Smokeless tobacco: Never Used  . Alcohol Use: Yes     Comment: wine  . Drug Use: No  . Sexual Activity: Not on file   Other Topics Concern  . Not on file   Social History Narrative   Health Maintenance  Topic Date Due  . HIV Screening  06/27/1968  . MAMMOGRAM  06/28/2003  . ZOSTAVAX  06/27/2013  . COLONOSCOPY  06/30/2014  . INFLUENZA VACCINE  09/03/2014  . PAP SMEAR  06/30/2015  . TETANUS/TDAP  03/27/2024    The following portions of the patient's history were reviewed and updated as appropriate: allergies, current medications, past family history, past medical history, past social history, past surgical history and problem list.  Review of Systems  Patient denies headache, fevers, malaise, unintentional weight loss, skin rash, eye pain, sinus congestion and sinus pain, sore throat, dysphagia,  hemoptysis , cough, dyspnea, wheezing, chest pain, palpitations, orthopnea, edema, abdominal pain, nausea, melena, diarrhea, constipation, flank pain, dysuria, hematuria, urinary  Frequency, nocturia, numbness, tingling, seizures,  Focal weakness, Loss of consciousness,  Tremor, insomnia, depression, anxiety, and suicidal ideation.       Objective:   BP 100/62 mmHg  Pulse 71  Temp(Src) 97.9 F (36.6 C) (Oral)  Resp 14  Ht 5\' 5"  (1.651 m)  Wt 122 lb 8 oz (55.566 kg)  BMI 20.39 kg/m2  SpO2 98%  General appearance: alert, cooperative and appears stated age Head: Normocephalic, without obvious abnormality, atraumatic Eyes: conjunctivae/corneas clear. PERRL, EOM's intact. Fundi benign. Ears: normal TM's and external ear canals both ears Nose: Nares normal. Septum midline. Mucosa normal. No drainage or sinus tenderness. Throat: lips, mucosa, and tongue normal; teeth and gums normal Neck: no adenopathy, no carotid bruit, no JVD, supple, symmetrical, trachea midline and thyroid not enlarged, symmetric, no tenderness/mass/nodules Lungs: clear to auscultation bilaterally Breasts: normal appearance, no masses or tenderness Heart: regular rate and rhythm, S1, S2 normal, no murmur, click, rub or gallop Abdomen: soft, non-tender; bowel sounds normal; no masses,  no organomegaly Extremities: extremities normal, atraumatic, no cyanosis or edema Pulses: 2+ and symmetric Skin: Skin color, texture, turgor normal. No rashes or lesions Neurologic: Alert and oriented X 3, normal strength and tone. Normal symmetric reflexes. Normal coordination and gait.   .    Assessment and Plan:    Problem List Items Addressed This Visit    Routine general medical examination at a health care facility    Annual wellness  exam was done as well as a comprehensive physical exam  and management of acute and chronic conditions .  During the course of the visit the patient was educated and counseled about appropriate screening and preventive services including :  diabetes screening, lipid analysis with projected  10 year  risk for CAD , nutrition counseling, colorectal cancer screening, and recommended immunizations.  Printed recommendations for health maintenance screenings was given.        Post-menopausal osteoporosis    She had several boes  fractured in her foot  Several months ago due to blunt trauma, which did not heal for weeks unitl she resume wearing regular shoes..Discussed the current controversies surrounding the risks and benefits of calcium supplementation.  Encouraged her to increase dietary calcium through natural foods including almond/coconut milk       Other Visit Diagnoses    Breast cancer screening    -  Primary    Relevant Orders    MM DIGITAL SCREENING BILATERAL    Colon cancer screening        Relevant Orders    Ambulatory referral to Gastroenterology    Need for hepatitis C screening test        Relevant Orders    Hepatitis C antibody    Weight gain        Hyperlipidemia        Relevant Orders    Lipid panel (Completed)    Other fatigue        Relevant Orders    CBC with Differential/Platelet (Completed)    Comprehensive metabolic panel (Completed)    TSH (Completed)    Osteoporosis        Relevant Orders    Vit D  25 hydroxy (rtn osteoporosis monitoring) (Completed)    Encounter for immunization        Need for prophylactic vaccination with combined diphtheria-tetanus-pertussis (DTP) vaccine        Relevant Orders    Tdap vaccine greater than or equal to 7yo IM (Completed)

## 2014-03-27 NOTE — Patient Instructions (Signed)
I recommend getting the majority of your calcium and Vitamin D  through diet rather than supplements given the recent association of calcium supplements with increased coronary artery calcium scores  Unsweetened almond/coconut milk is a great low calorie low carb way to increase your dietary calcium and vitamin D.  Try the blue [facility] Maintenance Adopting a healthy lifestyle and getting preventive care can go a long way to promote health and wellness. Talk with your health care provider about what schedule of regular examinations is right for you. This is a good chance for you to check in with your provider about disease prevention and staying healthy. In between checkups, there are plenty of things you can do on your own. Experts have done a lot of research about which lifestyle changes and preventive measures are most likely to keep you healthy. Ask your health care provider for more information. WEIGHT AND DIET  Eat a healthy diet  Be sure to include plenty of vegetables, fruits, low-fat dairy products, and lean protein.  Do not eat a lot of foods high in solid fats, added sugars, or salt.  Get regular exercise. This is one of the most important things you can do for your health.  Most adults should exercise for at least 150 minutes each week. The exercise should increase your heart rate and make you sweat (moderate-intensity exercise).  Most adults should also do strengthening exercises at least twice a week. This is in addition to the moderate-intensity exercise.  Maintain a healthy weight  Body mass index (BMI) is a measurement that can be used to identify possible weight problems. It estimates body fat based on height and weight. Your health care provider can help determine your BMI and help you achieve or maintain a healthy weight.  For females 40 years of age and older:   A BMI below 18.5 is considered underweight.  A BMI of 18.5 to 24.9 is normal.  A BMI of  25 to 29.9 is considered overweight.  A BMI of 30 and above is considered obese.  Watch levels of cholesterol and blood lipids  You should start having your blood tested for lipids and cholesterol at 61 years of age, then have this test every 5 years.  You may need to have your cholesterol levels checked more often if:  Your lipid or cholesterol levels are high.  You are older than 61 years of age.  You are at high risk for heart disease.  CANCER SCREENING   Lung Cancer  Lung cancer screening is recommended for adults 68-72 years old who are at high risk for lung cancer because of a history of smoking.  A yearly low-dose CT scan of the lungs is recommended for people who:  Currently smoke.  Have quit within the past 15 years.  Have at least a 30-pack-year history of smoking. A pack year is smoking an average of one pack of cigarettes a day for 1 year.  Yearly screening should continue until it has been 15 years since you quit.  Yearly screening should stop if you develop a health problem that would prevent you from having lung cancer treatment.  Breast Cancer  Practice breast self-awareness. This means understanding how your breasts normally appear and feel.  It also means doing regular breast self-exams. Let your health care provider know about any changes, no matter how small.  If you are in your 20s or 30s, you should have a clinical breast exam (CBE)  by a health care provider every 1-3 years as part of a regular health exam.  If you are 78 or older, have a CBE every year. Also consider having a breast X-ray (mammogram) every year.  If you have a family history of breast cancer, talk to your health care provider about genetic screening.  If you are at high risk for breast cancer, talk to your health care provider about having an MRI and a mammogram every year.  Breast cancer gene (BRCA) assessment is recommended for women who have family members with BRCA-related  cancers. BRCA-related cancers include:  Breast.  Ovarian.  Tubal.  Peritoneal cancers.  Results of the assessment will determine the need for genetic counseling and BRCA1 and BRCA2 testing. Cervical Cancer Routine pelvic examinations to screen for cervical cancer are no longer recommended for nonpregnant women who are considered low risk for cancer of the pelvic organs (ovaries, uterus, and vagina) and who do not have symptoms. A pelvic examination may be necessary if you have symptoms including those associated with pelvic infections. Ask your health care provider if a screening pelvic exam is right for you.   The Pap test is the screening test for cervical cancer for women who are considered at risk.  If you had a hysterectomy for a problem that was not cancer or a condition that could lead to cancer, then you no longer need Pap tests.  If you are older than 65 years, and you have had normal Pap tests for the past 10 years, you no longer need to have Pap tests.  If you have had past treatment for cervical cancer or a condition that could lead to cancer, you need Pap tests and screening for cancer for at least 20 years after your treatment.  If you no longer get a Pap test, assess your risk factors if they change (such as having a new sexual partner). This can affect whether you should start being screened again.  Some women have medical problems that increase their chance of getting cervical cancer. If this is the case for you, your health care provider may recommend more frequent screening and Pap tests.  The human papillomavirus (HPV) test is another test that may be used for cervical cancer screening. The HPV test looks for the virus that can cause cell changes in the cervix. The cells collected during the Pap test can be tested for HPV.  The HPV test can be used to screen women 52 years of age and older. Getting tested for HPV can extend the interval between normal Pap tests from  three to five years.  An HPV test also should be used to screen women of any age who have unclear Pap test results.  After 61 years of age, women should have HPV testing as often as Pap tests.  Colorectal Cancer  This type of cancer can be detected and often prevented.  Routine colorectal cancer screening usually begins at 61 years of age and continues through 61 years of age.  Your health care provider may recommend screening at an earlier age if you have risk factors for colon cancer.  Your health care provider may also recommend using home test kits to check for hidden blood in the stool.  A small camera at the end of a tube can be used to examine your colon directly (sigmoidoscopy or colonoscopy). This is done to check for the earliest forms of colorectal cancer.  Routine screening usually begins at age 3.  Direct  examination of the colon should be repeated every 5-10 years through 61 years of age. However, you may need to be screened more often if early forms of precancerous polyps or small growths are found. Skin Cancer  Check your skin from head to toe regularly.  Tell your health care provider about any new moles or changes in moles, especially if there is a change in a mole's shape or color.  Also tell your health care provider if you have a mole that is larger than the size of a pencil eraser.  Always use sunscreen. Apply sunscreen liberally and repeatedly throughout the day.  Protect yourself by wearing long sleeves, pants, a wide-brimmed hat, and sunglasses whenever you are outside. HEART DISEASE, DIABETES, AND HIGH BLOOD PRESSURE   Have your blood pressure checked at least every 1-2 years. High blood pressure causes heart disease and increases the risk of stroke.  If you are between 32 years and 63 years old, ask your health care provider if you should take aspirin to prevent strokes.  Have regular diabetes screenings. This involves taking a blood sample to check  your fasting blood sugar level.  If you are at a normal weight and have a low risk for diabetes, have this test once every three years after 61 years of age.  If you are overweight and have a high risk for diabetes, consider being tested at a younger age or more often. PREVENTING INFECTION  Hepatitis B  If you have a higher risk for hepatitis B, you should be screened for this virus. You are considered at high risk for hepatitis B if:  You were born in a country where hepatitis B is common. Ask your health care provider which countries are considered high risk.  Your parents were born in a high-risk country, and you have not been immunized against hepatitis B (hepatitis B vaccine).  You have HIV or AIDS.  You use needles to inject street drugs.  You live with someone who has hepatitis B.  You have had sex with someone who has hepatitis B.  You get hemodialysis treatment.  You take certain medicines for conditions, including cancer, organ transplantation, and autoimmune conditions. Hepatitis C  Blood testing is recommended for:  Everyone born from [birth year] through 1967.  Anyone with known risk factors for hepatitis C. Sexually transmitted infections (STIs)  You should be screened for sexually transmitted infections (STIs) including gonorrhea and chlamydia if:  You are sexually active and are younger than 61 years of age.  You are older than 61 years of age and your health care provider tells you that you are at risk for this type of infection.  Your sexual activity has changed since you were last screened and you are at an increased risk for chlamydia or gonorrhea. Ask your health care provider if you are at risk.  If you do not have HIV, but are at risk, it may be recommended that you take a prescription medicine daily to prevent HIV infection. This is called pre-exposure prophylaxis (PrEP). You are considered at risk if:  You are sexually active and do not regularly use  condoms or know the HIV status of your partner(s).  You take drugs by injection.  You are sexually active with a partner who has HIV. Talk with your health care provider about whether you are at high risk of being infected with HIV. If you choose to begin PrEP, you should first be tested for HIV. You should then be tested  every 3 months for as long as you are taking PrEP.  PREGNANCY   If you are premenopausal and you may become pregnant, ask your health care provider about preconception counseling.  If you may become pregnant, take 400 to 800 micrograms (mcg) of folic acid every day.  If you want to prevent pregnancy, talk to your health care provider about birth control (contraception). OSTEOPOROSIS AND MENOPAUSE   Osteoporosis is a disease in which the bones lose minerals and strength with aging. This can result in serious bone fractures. Your risk for osteoporosis can be identified using a bone density scan.  If you are 76 years of age or older, or if you are at risk for osteoporosis and fractures, ask your health care provider if you should be screened.  Ask your health care provider whether you should take a calcium or vitamin D supplement to lower your risk for osteoporosis.  Menopause may have certain physical symptoms and risks.  Hormone replacement therapy may reduce some of these symptoms and risks. Talk to your health care provider about whether hormone replacement therapy is right for you.  HOME CARE INSTRUCTIONS   Schedule regular health, dental, and eye exams.  Stay current with your immunizations.   Do not use any tobacco products including cigarettes, chewing tobacco, or electronic cigarettes.  If you are pregnant, do not drink alcohol.  If you are breastfeeding, limit how much and how often you drink alcohol.  Limit alcohol intake to no more than 1 drink per day for nonpregnant women. One drink equals 12 ounces of beer, 5 ounces of wine, or 1 ounces of hard  liquor.  Do not use street drugs.  Do not share needles.  Ask your health care provider for help if you need support or information about quitting drugs.  Tell your health care provider if you often feel depressed.  Tell your health care provider if you have ever been abused or do not feel safe at home. Document Released: 08/04/2010 Document Revised: 06/05/2013 Document Reviewed: 12/21/2012 [facility] Patient Information 2015 [place], [place]. This information is not intended to replace advice given to you by your health care provider. Make sure you discuss any questions you have with your health care provider.

## 2014-03-28 ENCOUNTER — Ambulatory Visit (INDEPENDENT_AMBULATORY_CARE_PROVIDER_SITE_OTHER): Payer: BLUE CROSS/BLUE SHIELD

## 2014-03-28 ENCOUNTER — Encounter: Payer: Self-pay | Admitting: Podiatry

## 2014-03-28 ENCOUNTER — Ambulatory Visit (INDEPENDENT_AMBULATORY_CARE_PROVIDER_SITE_OTHER): Payer: BLUE CROSS/BLUE SHIELD | Admitting: Podiatry

## 2014-03-28 VITALS — BP 97/60 | HR 70 | Resp 18

## 2014-03-28 DIAGNOSIS — S92911D Unspecified fracture of right toe(s), subsequent encounter for fracture with routine healing: Secondary | ICD-10-CM

## 2014-03-28 DIAGNOSIS — M79674 Pain in right toe(s): Secondary | ICD-10-CM

## 2014-03-28 LAB — CBC WITH DIFFERENTIAL/PLATELET
Basophils Absolute: 0 10*3/uL (ref 0.0–0.1)
Basophils Relative: 1 % (ref 0.0–3.0)
Eosinophils Absolute: 0.1 10*3/uL (ref 0.0–0.7)
Eosinophils Relative: 2.6 % (ref 0.0–5.0)
HCT: 37.9 % (ref 36.0–46.0)
Hemoglobin: 13 g/dL (ref 12.0–15.0)
Lymphocytes Relative: 31.7 % (ref 12.0–46.0)
Lymphs Abs: 1.6 10*3/uL (ref 0.7–4.0)
MCHC: 34.4 g/dL (ref 30.0–36.0)
MCV: 88.8 fl (ref 78.0–100.0)
Monocytes Absolute: 0.6 10*3/uL (ref 0.1–1.0)
Monocytes Relative: 11.5 % (ref 3.0–12.0)
Neutro Abs: 2.6 10*3/uL (ref 1.4–7.7)
Neutrophils Relative %: 53.2 % (ref 43.0–77.0)
Platelets: 210 10*3/uL (ref 150.0–400.0)
RBC: 4.27 Mil/uL (ref 3.87–5.11)
RDW: 12.9 % (ref 11.5–15.5)
WBC: 4.9 10*3/uL (ref 4.0–10.5)

## 2014-03-28 LAB — COMPREHENSIVE METABOLIC PANEL
ALT: 31 U/L (ref 0–35)
AST: 27 U/L (ref 0–37)
Albumin: 4.5 g/dL (ref 3.5–5.2)
Alkaline Phosphatase: 61 U/L (ref 39–117)
BUN: 13 mg/dL (ref 6–23)
CO2: 27 mEq/L (ref 19–32)
Calcium: 9.4 mg/dL (ref 8.4–10.5)
Chloride: 106 mEq/L (ref 96–112)
Creatinine, Ser: 0.86 mg/dL (ref 0.40–1.20)
GFR: 71.36 mL/min (ref >60.00)
Glucose, Bld: 76 mg/dL (ref 70–99)
Potassium: 4 mEq/L (ref 3.5–5.1)
Sodium: 139 mEq/L (ref 135–145)
Total Bilirubin: 0.3 mg/dL (ref 0.2–1.2)
Total Protein: 6.9 g/dL (ref 6.0–8.3)

## 2014-03-28 LAB — LIPID PANEL
Cholesterol: 177 mg/dL (ref 0–200)
HDL: 84.2 mg/dL (ref >39.00)
LDL Cholesterol: 80 mg/dL (ref 0–99)
NonHDL: 92.8
Total CHOL/HDL Ratio: 2
Triglycerides: 64 mg/dL (ref 0.0–149.0)
VLDL: 12.8 mg/dL (ref 0.0–40.0)

## 2014-03-28 LAB — TSH: TSH: 2.25 u[IU]/mL (ref 0.35–4.50)

## 2014-03-28 LAB — VITAMIN D 25 HYDROXY (VIT D DEFICIENCY, FRACTURES): VITD: 21.73 ng/mL — ABNORMAL LOW (ref 30.00–100.00)

## 2014-03-28 NOTE — Assessment & Plan Note (Signed)
She had several boes fractured in her foot  Several months ago due to blunt trauma, which did not heal for weeks unitl she resume wearing regular shoes..Discussed the current controversies surrounding the risks and benefits of calcium supplementation.  Encouraged her to increase dietary calcium through natural foods including almond/coconut milk

## 2014-03-28 NOTE — Assessment & Plan Note (Signed)

## 2014-03-29 ENCOUNTER — Encounter: Payer: Self-pay | Admitting: Internal Medicine

## 2014-03-29 DIAGNOSIS — E559 Vitamin D deficiency, unspecified: Secondary | ICD-10-CM | POA: Insufficient documentation

## 2014-03-29 LAB — HEPATITIS C ANTIBODY: HCV Ab: NEGATIVE

## 2014-03-29 MED ORDER — ERGOCALCIFEROL 1.25 MG (50000 UT) PO CAPS: 50000.0000 [IU] | ORAL_CAPSULE | ORAL | Status: DC

## 2014-03-29 NOTE — Progress Notes (Signed)
Patient ID: Joy Edwards, female   DOB: 1955/03/20, 59 y.o.   MRN: [id number]  Subjective: 59 year old female presents the office today for follow-up evaluation of right fourth digit fracture. She states that since last appointment her symptoms have improved and she is able to wear regular shoe without difficulty. She states that she gets some intermittent discomfort to the toe however it is much improved. She denies any swelling or redness overlying the area. Denies any acute injury or trauma since last appointment. No other complaints at this time.  Objective: AAO 3, NAD  DP/PT pulses palpable, CRT less than 3 seconds Protective sensation intact with Simms Weinstein monofilament, vibratory sensation intact, Achilles tendon reflex intact. Currently, there is no tenderness to palpation overlying the fourth digit or along the metatarsal. There is no pain with MTPJ range of motion. There is no other areas of tenderness of bilateral lower extremity is. There is no overlying edema, erythema, increase in warmth. MMT 5/5, ROM WNL  no open lesions or pre-ulcer lesions identified. No pain with calf compression, swelling, warmth, erythema.  Assessment: 59 year old female pop evaluation right fourth digit fracture.  Plan: -X-rays were obtained and reviewed the patient. -Treatment options were discussed the patient including all alternatives, risks, complications. -At this time, as she is has been to get reduction pain shooting continue with regular shoe and there is increased consolidation on x-ray. -Continue to monitor the area for any change and call the office should there be any. Follow-up as needed. In the meantime, encouraged to call the office with any questions, concerns, change in symptoms.

## 2014-03-29 NOTE — Addendum Note (Signed)
Addended by: Crecencio Mc on: 03/29/2014 10:14 AM   Modules accepted: Orders

## 2014-07-23 ENCOUNTER — Telehealth: Payer: Self-pay

## 2014-07-23 NOTE — Telephone Encounter (Signed)
LMTCB regarding scheduling mammogram

## 2014-10-22 ENCOUNTER — Ambulatory Visit (INDEPENDENT_AMBULATORY_CARE_PROVIDER_SITE_OTHER): Payer: BLUE CROSS/BLUE SHIELD | Admitting: Family Medicine

## 2014-10-22 VITALS — BP 126/78 | HR 87 | Temp 98.6°F | Ht 65.0 in | Wt 122.0 lb

## 2014-10-22 DIAGNOSIS — J069 Acute upper respiratory infection, unspecified: Secondary | ICD-10-CM | POA: Diagnosis not present

## 2014-10-22 MED ORDER — BENZONATATE 200 MG PO CAPS: 200.0000 mg | ORAL_CAPSULE | Freq: Three times a day (TID) | ORAL | Status: DC | PRN

## 2014-10-22 NOTE — Progress Notes (Signed)
Pre visit review using our clinic review tool, if applicable. No additional management support is needed unless otherwise documented below in the visit note. 

## 2014-10-22 NOTE — Patient Instructions (Signed)
Nice to meet you. You likely have a viral illness leading to your nasal congestion and sore throat.  You can take over the counter psuedoephedrine as a decongestant. Start taking flonase 2 sprays in each nostril daily. You can take over the counter zyrtec as well.  If you develop fever, purulent nasal discharge, cough productive of purulent material, shortness of breath, headache, or feel poorly please seek medical attention.  If symptoms persist longer than 7 total days please follow-up in the office.

## 2014-10-23 ENCOUNTER — Encounter: Payer: Self-pay | Admitting: Family Medicine

## 2014-10-23 NOTE — Assessment & Plan Note (Addendum)
Patient with likely viral upper respiratory infection. Only 2 days of symptoms and absence of fever with sinus pressure makes bacterial infection less likely. Discussed this with the patient. Well appearing and vitals stable. Normal pulm exam. Advised on flonase use and antihistamines. Can use pseudoephedrine OTC as decongestant. Nasal saline as well. Tessalon for cough. Given return precautions. Will return to care if not improving by the end of the week.

## 2014-10-23 NOTE — Progress Notes (Signed)
Patient ID: TENNYSON KALLEN, female   DOB: [birth year], 59 y.o.   MRN: [id number]  Tommi Rumps, MD Phone: [phone]  Joy Edwards is a 59 y.o. female who presents today for same day appointment.  Sinus congestion: patient notes 2 days of sinus pressure and congestion. Started with post nasal drip and sore throat, then developed sinus pressure. Notes green mucus from nose. Some mild cough with green mucus. No thick yellow/white productive material. No fever. Notes this occurred after exposure to possible mold in a house that she has been working on with her daughter. Notes sick contacts with daughter and grandchild having similar symptoms. Has been taking mucinex and dayquil with good benefit. Denies dyspnea.   PMH: nonsmoker.    ROS see HPI  Objective  Physical Exam Filed Vitals:   10/22/14 1455  BP: 126/78  Pulse: 87  Temp: 98.6 F (37 C)    Physical Exam  Constitutional: She is well-developed, well-nourished, and in no distress.  Well appearing  HENT:  Head: Normocephalic and atraumatic.  Right Ear: External ear normal.  Left Ear: External ear normal.  Mild posterior OP erythema, no exudate, mild maxillary sinus tenderness  Eyes: Conjunctivae are normal. Pupils are equal, round, and reactive to light.  Neck: Neck supple.  Cardiovascular: Normal rate, regular rhythm and normal heart sounds.  Exam reveals no gallop and no friction rub.   No murmur heard. Pulmonary/Chest: Effort normal and breath sounds normal. No respiratory distress. She has no wheezes. She has no rales.  Lymphadenopathy:    She has no cervical adenopathy.  Neurological: She is alert. Gait normal.  Skin: Skin is warm and dry. She is not diaphoretic.     Assessment/Plan: Please see individual problem list.  URI (upper respiratory infection) Patient with likely viral upper respiratory infection. Only 2 days of symptoms and absence of fever with sinus pressure makes bacterial infection less likely.  Discussed this with the patient. Well appearing and vitals stable. Normal pulm exam. Advised on flonase use and antihistamines. Can use pseudoephedrine OTC as decongestant. Nasal saline as well. Tessalon for cough. Given return precautions. Will return to care if not improving by the end of the week.     Meds ordered this encounter  Medications  . benzonatate (TESSALON) 200 MG capsule    Sig: Take 1 capsule (200 mg total) by mouth 3 (three) times daily as needed for cough.    Dispense:  20 capsule    Refill:  0    Tommi Rumps

## 2014-10-26 ENCOUNTER — Telehealth: Payer: Self-pay | Admitting: Internal Medicine

## 2014-10-26 NOTE — Telephone Encounter (Signed)
Pt called to state her discharge from her throat is now a white pus mucus. Please advise pt/msn

## 2014-10-29 ENCOUNTER — Encounter: Payer: Self-pay | Admitting: Family Medicine

## 2014-10-29 ENCOUNTER — Ambulatory Visit (INDEPENDENT_AMBULATORY_CARE_PROVIDER_SITE_OTHER): Payer: BLUE CROSS/BLUE SHIELD | Admitting: Family Medicine

## 2014-10-29 VITALS — BP 106/74 | HR 73 | Temp 98.0°F | Ht 65.0 in | Wt 120.4 lb

## 2014-10-29 DIAGNOSIS — J0100 Acute maxillary sinusitis, unspecified: Secondary | ICD-10-CM

## 2014-10-29 MED ORDER — DOXYCYCLINE HYCLATE 100 MG PO TABS: 100.0000 mg | ORAL_TABLET | Freq: Two times a day (BID) | ORAL | Status: DC

## 2014-10-29 MED ORDER — BENZONATATE 200 MG PO CAPS: 200.0000 mg | ORAL_CAPSULE | Freq: Three times a day (TID) | ORAL | Status: DC | PRN

## 2014-10-29 NOTE — Progress Notes (Signed)
Patient ID: Joy Edwards, female   DOB: Jun 17, 1955, 59 y.o.   MRN: [id number]  Tommi Rumps, MD Phone: [phone]  Joy Edwards is a 59 y.o. female who presents today for f/u.  Sinus congestion: patient reports persistent sinus congestion since her last office visit. Notes some white material from her nose mixed with green. Non-productive cough. Some chills, though no fever. Some ear fullness. No shortness of breath. Sick contacts have improved. Taking tessalon and zyrtec D with some benefit.   PMH: nonsmoker.    ROS see HPI  Objective  Physical Exam Filed Vitals:   10/29/14 1351  BP: 106/74  Pulse: 73  Temp: 98 F (36.7 C)    Physical Exam  Constitutional: She is well-developed, well-nourished, and in no distress.  HENT:  Head: Normocephalic and atraumatic.  Right Ear: External ear normal.  Left Ear: External ear normal.  Mouth/Throat: Oropharynx is clear and moist. No oropharyngeal exudate.  Normal TMs bilaterally, no tonsillar swelling Bilateral maxillary sinus tenderness  Neck: Neck supple.  Cardiovascular: Normal rate, regular rhythm and normal heart sounds.  Exam reveals no gallop and no friction rub.   No murmur heard. Pulmonary/Chest: Effort normal and breath sounds normal. No respiratory distress. She has no wheezes. She has no rales.  Lymphadenopathy:    She has no cervical adenopathy.  Neurological: She is alert.  Skin: Skin is warm and dry. She is not diaphoretic.     Assessment/Plan: Please see individual problem list.  Sinusitis Patient now with 10 days of symptoms and persistent nasal discharge with sinus pressure. Likely with bacterial sinusitis at this time. Vitals stable. Will start on doxycycline 100 mg BID for 7 days given penicillin allergy. Refill tessalon. Given return precautions.     Meds ordered this encounter  Medications  . doxycycline (VIBRA-TABS) 100 MG tablet    Sig: Take 1 tablet (100 mg total) by mouth 2 (two) times daily.     Dispense:  14 tablet    Refill:  0  . benzonatate (TESSALON) 200 MG capsule    Sig: Take 1 capsule (200 mg total) by mouth 3 (three) times daily as needed for cough.    Dispense:  20 capsule    Refill:  0    Tommi Rumps

## 2014-10-29 NOTE — Assessment & Plan Note (Signed)
Patient now with 10 days of symptoms and persistent nasal discharge with sinus pressure. Likely with bacterial sinusitis at this time. Vitals stable. Will start on doxycycline 100 mg BID for 7 days given penicillin allergy. Refill tessalon. Given return precautions.

## 2014-10-29 NOTE — Patient Instructions (Signed)
Nice to see you. We will start you on doxycycline for a sinus infection.  If you develop fever, chills, shortness of breath, or do not improve please seek medical attention.   Sinusitis Sinusitis is redness, soreness, and puffiness (inflammation) of the air pockets in the bones of your face (sinuses). The redness, soreness, and puffiness can cause air and mucus to get trapped in your sinuses. This can allow germs to grow and cause an infection.  HOME CARE   Drink enough fluids to keep your pee (urine) clear or pale yellow.  Use a humidifier in your home.  Run a hot shower to create steam in the bathroom. Sit in the bathroom with the door closed. Breathe in the steam 3-4 times a day.  Put a warm, moist washcloth on your face 3-4 times a day, or as told by your doctor.  Use salt water sprays (saline sprays) to wet the thick fluid in your nose. This can help the sinuses drain.  Only take medicine as told by your doctor. GET HELP RIGHT AWAY IF:   Your pain gets worse.  You have very bad headaches.  You are sick to your stomach (nauseous).  You throw up (vomit).  You are very sleepy (drowsy) all the time.  Your face is puffy (swollen).  Your vision changes.  You have a stiff neck.  You have trouble breathing. MAKE SURE YOU:   Understand these instructions.  Will watch your condition.  Will get help right away if you are not doing well or get worse. Document Released: 07/08/2007 Document Revised: 10/14/2011 Document Reviewed: 08/25/2011 [facility] Patient Information 2015 [place], [place]. This information is not intended to replace advice given to you by your health care provider. Make sure you discuss any questions you have with your health care provider.

## 2014-10-29 NOTE — Telephone Encounter (Signed)
Tried to call patient to find out if throat was any better. LM on voicemail for her to return my call.

## 2014-10-29 NOTE — Telephone Encounter (Signed)
Patient scheduled appointment to come in to be seen 10/29/14

## 2014-10-29 NOTE — Progress Notes (Signed)
Pre visit review using our clinic review tool, if applicable. No additional management support is needed unless otherwise documented below in the visit note. 

## 2015-02-19 ENCOUNTER — Ambulatory Visit (INDEPENDENT_AMBULATORY_CARE_PROVIDER_SITE_OTHER)
Admission: RE | Admit: 2015-02-19 | Discharge: 2015-02-19 | Disposition: A | Discharge status: Home / Self Care | Payer: BLUE CROSS/BLUE SHIELD | Source: Ambulatory Visit | Attending: Family Medicine | Admitting: Family Medicine

## 2015-02-19 ENCOUNTER — Encounter: Payer: Self-pay | Admitting: Family Medicine

## 2015-02-19 ENCOUNTER — Ambulatory Visit (INDEPENDENT_AMBULATORY_CARE_PROVIDER_SITE_OTHER): Payer: BLUE CROSS/BLUE SHIELD | Admitting: Family Medicine

## 2015-02-19 VITALS — BP 100/62 | HR 83 | Temp 98.0°F | Ht 65.0 in | Wt 118.2 lb

## 2015-02-19 DIAGNOSIS — R05 Cough: Secondary | ICD-10-CM

## 2015-02-19 DIAGNOSIS — R059 Cough, unspecified: Secondary | ICD-10-CM

## 2015-02-19 DIAGNOSIS — J189 Pneumonia, unspecified organism: Secondary | ICD-10-CM

## 2015-02-19 MED ORDER — PREDNISONE 50 MG PO TABS: ORAL_TABLET | ORAL | Status: DC

## 2015-02-19 MED ORDER — AZITHROMYCIN 250 MG PO TABS: ORAL_TABLET | ORAL | Status: DC

## 2015-02-19 MED ORDER — HYDROCOD POLST-CPM POLST ER 10-8 MG/5ML PO SUER: 5.0000 mL | Freq: Two times a day (BID) | ORAL | Status: DC | PRN

## 2015-02-19 NOTE — Progress Notes (Addendum)
Subjective:  Patient ID: Joy Edwards, female    DOB: [birth year]  Age: 60 y.o. MRN: [id number]  CC: Cough, Congestion  HPI:  60 year old female presents to the clinic today with the above complaints.  Cough/Congestion  Patient reports that she's been sick for nearly 5 weeks.  She reports significant sinus congestion and pressure as well as postnasal drip and cough.  Cough is severe and unrelenting.  Additionally, she reports discolored nasal discharge.  She reports subjective fever and chills.  She had an Evisit yesterday and was given Augmentin for (I'm assuming) acute sinusitis.  She presents today with continued symptoms. She has just started the antibiotic. She states she's also use something for cough but is unclear what it is. She's had little improvement with that.  No known exacerbating factors. No relieving factors.  Social Hx   Social History   Social History  . Marital Status: Married    Spouse Name: N/A  . Number of Children: N/A  . Years of Education: N/A   Social History Main Topics  . Smoking status: Never Smoker   . Smokeless tobacco: Never Used  . Alcohol Use: Yes     Comment: wine  . Drug Use: No  . Sexual Activity: Not Asked   Other Topics Concern  . None   Social History Narrative   Review of Systems  Constitutional: Positive for fever and chills.  HENT: Positive for congestion, postnasal drip and sinus pressure.   Respiratory: Positive for cough.    Objective:  BP 100/62 mmHg  Pulse 83  Temp(Src) 98 F (36.7 C) (Oral)  Ht 5\' 5"  (1.651 m)  Wt 118 lb 4 oz (53.638 kg)  BMI 19.68 kg/m2  SpO2 95%  BP/Weight 02/19/2015 10/29/2014 [id number]  Systolic BP 123XX123 [id number]  Diastolic BP 62 74 78  Wt. (Lbs) 118.25 120.4 122  BMI 19.68 20.04 20.3   Physical Exam  Constitutional:  Appears mildly ill but in no acute distress.  HENT:  Head: Normocephalic and atraumatic.  Oropharynx clear. TMs normal bilaterally. Severe maxillary sinus  tenderness to palpation.  Eyes: Conjunctivae are normal.  Cardiovascular: Normal rate and regular rhythm.   Soft 2/6 systolic murmur.  Pulmonary/Chest: Effort normal. No respiratory distress. She has no wheezes.  Neurological: She is alert.  Psychiatric: She has a normal mood and affect.  Vitals reviewed.  Lab Results  Component Value Date   WBC 4.9 03/27/2014   HGB 13.0 03/27/2014   HCT 37.9 03/27/2014   PLT 210.0 03/27/2014   GLUCOSE 76 03/27/2014   CHOL 177 03/27/2014   TRIG 64.0 03/27/2014   HDL 84.20 03/27/2014   LDLCALC 80 03/27/2014   ALT 31 03/27/2014   AST 27 03/27/2014   NA 139 03/27/2014   K 4.0 03/27/2014   CL 106 03/27/2014   CREATININE 0.86 03/27/2014   BUN 13 03/27/2014   CO2 27 03/27/2014   TSH 2.25 03/27/2014    Assessment & Plan:   Problem List Items Addressed This Visit    CAP (community acquired pneumonia) - Primary    New problem. Given duration of her illness and severe cough chest x-ray was obtained. Chest x-ray revealed multifocal pneumonia. Patient to continue Augmentin. Adding azithromycin. Tussionex for cough. Also adding prednisone      Relevant Medications   Amoxicillin-Pot Clavulanate (AUGMENTIN PO)   chlorpheniramine-HYDROcodone (TUSSIONEX PENNKINETIC ER) 10-8 MG/5ML SUER   azithromycin (ZITHROMAX) 250 MG tablet    Other Visit Diagnoses    Cough  Relevant Medications    chlorpheniramine-HYDROcodone (TUSSIONEX PENNKINETIC ER) 10-8 MG/5ML SUER    Other Relevant Orders    DG Chest 2 View (Completed)       Follow-up: PRN  [place]

## 2015-02-19 NOTE — Progress Notes (Signed)
Pre visit review using our clinic review tool, if applicable. No additional management support is needed unless otherwise documented below in the visit note. 

## 2015-02-19 NOTE — Addendum Note (Signed)
Addended by: Coral Spikes on: 02/19/2015 04:49 PM   Modules accepted: Orders

## 2015-02-19 NOTE — Patient Instructions (Signed)
Take the antibiotic as prescribed.  I will call with the chest xray results.  Use the tussionex as directed for cough.  Take the prednisone as well.  Follow up if you fail to improve or worsen.  Take care  Dr. Lacinda Axon

## 2015-02-19 NOTE — Assessment & Plan Note (Addendum)
New problem. Given duration of her illness and severe cough chest x-ray was obtained. Chest x-ray revealed multifocal pneumonia. Patient to continue Augmentin. Adding azithromycin. Tussionex for cough. Also adding prednisone

## 2015-02-20 ENCOUNTER — Telehealth: Payer: Self-pay

## 2015-02-20 MED ORDER — ALBUTEROL SULFATE HFA 108 (90 BASE) MCG/ACT IN AERS: 2.0000 | INHALATION_SPRAY | Freq: Four times a day (QID) | RESPIRATORY_TRACT | Status: DC | PRN

## 2015-02-20 NOTE — Telephone Encounter (Signed)
I will call her back myself  She is a friend of the family.

## 2015-02-20 NOTE — Addendum Note (Signed)
Addended by: Crecencio Mc on: 02/20/2015 05:05 PM   Modules accepted: Orders

## 2015-02-20 NOTE — Telephone Encounter (Signed)
Pt called and stated that she was recently Dx with pneumonia, she wants to know if it is contagious because her granddaughter is coming over, and she also wants to know how long she should be out of work. Please advise./tvw

## 2015-02-26 ENCOUNTER — Ambulatory Visit (INDEPENDENT_AMBULATORY_CARE_PROVIDER_SITE_OTHER): Payer: BLUE CROSS/BLUE SHIELD | Admitting: Family Medicine

## 2015-02-26 ENCOUNTER — Encounter: Payer: Self-pay | Admitting: Family Medicine

## 2015-02-26 VITALS — BP 106/72 | HR 76 | Temp 98.4°F | Ht 65.0 in | Wt 118.8 lb

## 2015-02-26 DIAGNOSIS — J189 Pneumonia, unspecified organism: Secondary | ICD-10-CM | POA: Diagnosis not present

## 2015-02-26 MED ORDER — LEVOFLOXACIN 750 MG PO TABS
750.0000 mg | ORAL_TABLET | Freq: Every day | ORAL | Status: DC
Start: 2015-02-26 — End: 2015-05-24

## 2015-02-26 NOTE — Assessment & Plan Note (Signed)
Symptoms consistent with inadequately treated pneumonia versus sinusitis. Suspect given her symptoms likely related to pneumonia being inadequately treated with Augmentin and azithromycin. We will switch her to Levaquin as this will cover pneumonia and sinusitis. Discussed risk of tendon rupture and neuropathy with the Levaquin, though discussed that this was the treatment of choice for pneumonia not responsive to the other regimen. She can continue to use cough suppressant that she has at home. She will continue to stay well hydrated. She's given return precautions.

## 2015-02-26 NOTE — Patient Instructions (Signed)
Nice to see you. The concern is that your pneumonia and sinus infection may have not been adequately treated with Augmentin and azithromycin. Thus we will treat you with Levaquin. If you do not get better in the next 2-3 days please let us know. If you develop fevers, cough productive of blood, chest pressure, shortness of breath, numbness, weakness, vision changes, worsening headache, or any new or changing symptoms please seek medical attention.

## 2015-02-26 NOTE — Progress Notes (Signed)
Patient ID: Joy Edwards, female   DOB: 1955/04/19, 60 y.o.   MRN: [id number]  Tommi Rumps, MD Phone: [phone]  Joy Edwards is a 60 y.o. female who presents today for follow-up.  Patient was seen about a week ago and diagnosed with community-acquired pneumonia. She was treated with Augmentin and azithromycin. She notes she got a little bit better over the weekend, though over the last several days is worsening again. She is continuing to cough. She's coughing up yellow-green mucus. She feels fatigued and tired. She slept all day yesterday. She's been having chills. No fevers. She does note frontal sinus headache like her whole head is in a vice. She denies numbness, weakness, and vision changes. She notes that they had gotten better though once her sinuses became congested again got worse. She's been staying hydrated. She denies shortness of breath. She does note mild sharp central discomfort when coughing. No chest pressure. No chest pain at rest.  PMH: nonsmoker.   ROS see history of present illness  Objective  Physical Exam Filed Vitals:   02/26/15 1120  BP: 106/72  Pulse: 76  Temp: 98.4 F (36.9 C)    Physical Exam  Constitutional: No distress.  Tired-appearing, appears as though she does not feel well, nontoxic  HENT:  Head: Normocephalic and atraumatic.  Right Ear: External ear normal.  Left Ear: External ear normal.  Mouth/Throat: Oropharynx is clear and moist. No oropharyngeal exudate.  Normal TMs bilaterally  Eyes: Conjunctivae are normal. Pupils are equal, round, and reactive to light.  Neck: Neck supple.  Cardiovascular: Normal rate, regular rhythm and normal heart sounds.  Exam reveals no gallop and no friction rub.   No murmur heard. Pulmonary/Chest: Breath sounds normal. No respiratory distress. She has no wheezes. She has no rales. She exhibits tenderness (bilateral costochondral joints).  Lymphadenopathy:    She has no cervical adenopathy.    Neurological: She is alert.  CN 2-12 intact, 5/5 strength in bilateral biceps, triceps, grip, quads, hamstrings, plantar and dorsiflexion, sensation to light touch intact in bilateral UE and LE, normal gait, 2+ patellar reflexes  Skin: Skin is warm and dry. She is not diaphoretic.     Assessment/Plan: Please see individual problem list.  CAP (community acquired pneumonia) Symptoms consistent with inadequately treated pneumonia versus sinusitis. Suspect given her symptoms likely related to pneumonia being inadequately treated with Augmentin and azithromycin. We will switch her to Levaquin as this will cover pneumonia and sinusitis. Discussed risk of tendon rupture and neuropathy with the Levaquin, though discussed that this was the treatment of choice for pneumonia not responsive to the other regimen. She can continue to use cough suppressant that she has at home. She will continue to stay well hydrated. She's given return precautions.    Meds ordered this encounter  Medications  . levofloxacin (LEVAQUIN) 750 MG tablet    Sig: Take 1 tablet (750 mg total) by mouth daily.    Dispense:  5 tablet    Refill:  0    Tommi Rumps

## 2015-02-26 NOTE — Progress Notes (Signed)
Pre visit review using our clinic review tool, if applicable. No additional management support is needed unless otherwise documented below in the visit note. 

## 2015-05-24 ENCOUNTER — Ambulatory Visit (INDEPENDENT_AMBULATORY_CARE_PROVIDER_SITE_OTHER)
Admission: RE | Admit: 2015-05-24 | Discharge: 2015-05-24 | Disposition: A | Discharge status: Home / Self Care | Payer: BLUE CROSS/BLUE SHIELD | Source: Ambulatory Visit | Attending: Family Medicine | Admitting: Family Medicine

## 2015-05-24 ENCOUNTER — Encounter: Payer: Self-pay | Admitting: Family Medicine

## 2015-05-24 ENCOUNTER — Ambulatory Visit (INDEPENDENT_AMBULATORY_CARE_PROVIDER_SITE_OTHER): Payer: BLUE CROSS/BLUE SHIELD | Admitting: Family Medicine

## 2015-05-24 VITALS — BP 120/86 | HR 103 | Temp 98.1°F | Ht 65.0 in | Wt 117.5 lb

## 2015-05-24 DIAGNOSIS — J988 Other specified respiratory disorders: Secondary | ICD-10-CM | POA: Diagnosis not present

## 2015-05-24 DIAGNOSIS — R059 Cough, unspecified: Secondary | ICD-10-CM

## 2015-05-24 DIAGNOSIS — R05 Cough: Secondary | ICD-10-CM | POA: Diagnosis not present

## 2015-05-24 MED ORDER — LEVOFLOXACIN 500 MG PO TABS: 750.0000 mg | ORAL_TABLET | Freq: Every day | ORAL | Status: DC

## 2015-05-24 NOTE — Patient Instructions (Signed)
The nail issue is cause Beau lines (like related to recent illness, stunting of nail growth). Nothing to be worried about.  Get the chest xray.  Start the antibiotic. Take a probiotic daily.   Take care  Dr. Lacinda Axon

## 2015-05-24 NOTE — Progress Notes (Signed)
Pre visit review using our clinic review tool, if applicable. No additional management support is needed unless otherwise documented below in the visit note. 

## 2015-05-25 DIAGNOSIS — J988 Other specified respiratory disorders: Secondary | ICD-10-CM | POA: Insufficient documentation

## 2015-05-25 NOTE — Assessment & Plan Note (Signed)
New acute problem. Patient does not appear well and has severe cough and reports of purulent nasal discharge. Xray was obtained and was negative. Will treat empirically with Levaquin. Advised use of probiotic.

## 2015-05-25 NOTE — Progress Notes (Signed)
   Subjective:  Patient ID: Joy Edwards, female    DOB: Nov 06, 1955  Age: 60 y.o. MRN: [id number]  CC:  Cough, ? sinusitis  HPI:  59 year old female presents with the above complaints.  Patient states that she's not been feeling well since Monday. She states that she's had green nasal discharge, headache, severe cough. She said no associated fever. She has had chills. She endorses shortness of breath. She states that her symptoms have particularly been worse since Wednesday. She's been using Mucinex and Flonase with no improvement. No known exacerbating factors.  Social Hx   Social History   Social History  . Marital Status: Married    Spouse Name: N/A  . Number of Children: N/A  . Years of Education: N/A   Social History Main Topics  . Smoking status: Never Smoker   . Smokeless tobacco: Never Used  . Alcohol Use: Yes     Comment: wine  . Drug Use: No  . Sexual Activity: Not Asked   Other Topics Concern  . None   Social History Narrative   Review of Systems  Constitutional: Positive for chills. Negative for fever.  HENT: Positive for sinus pressure.   Respiratory: Positive for cough and shortness of breath.   Neurological: Positive for headaches.   Objective:  BP 120/86 mmHg  Pulse 103  Temp(Src) 98.1 F (36.7 C) (Oral)  Ht 5\' 5"  (1.651 m)  Wt 117 lb 8 oz (53.298 kg)  BMI 19.55 kg/m2  SpO2 97%  BP/Weight 05/24/2015 02/26/2015 [id number]  Systolic BP 123456 [id number] [id number]  Diastolic BP 86 72 62  Wt. (Lbs) 117.5 118.8 118.25  BMI 19.55 19.77 19.68    Physical Exam  Constitutional: She is oriented to person, place, and time. She appears well-developed. No distress.  HENT:  Head: Normocephalic and atraumatic.  Mouth/Throat: Oropharynx is clear and moist.  Eyes: Conjunctivae are normal. No scleral icterus.  Cardiovascular: Regular rhythm.  Tachycardia present.   Pulmonary/Chest: Effort normal. She has no wheezes. She has no rales.  Neurological: She is alert and  oriented to person, place, and time.  Vitals reviewed.  Lab Results  Component Value Date   WBC 4.9 03/27/2014   HGB 13.0 03/27/2014   HCT 37.9 03/27/2014   PLT 210.0 03/27/2014   GLUCOSE 76 03/27/2014   CHOL 177 03/27/2014   TRIG 64.0 03/27/2014   HDL 84.20 03/27/2014   LDLCALC 80 03/27/2014   ALT 31 03/27/2014   AST 27 03/27/2014   NA 139 03/27/2014   K 4.0 03/27/2014   CL 106 03/27/2014   CREATININE 0.86 03/27/2014   BUN 13 03/27/2014   CO2 27 03/27/2014   TSH 2.25 03/27/2014    Assessment & Plan:   Problem List Items Addressed This Visit    Respiratory infection - Primary    New acute problem. Patient does not appear well and has severe cough and reports of purulent nasal discharge. Xray was obtained and was negative. Will treat empirically with Levaquin. Advised use of probiotic.      Relevant Orders   DG Chest 2 View (Completed)      Meds ordered this encounter  Medications  . levofloxacin (LEVAQUIN) 500 MG tablet    Sig: Take 1.5 tablets (750 mg total) by mouth daily.    Dispense:  10 tablet    Refill:  0   Follow-up: PRN  [place]

## 2015-09-12 ENCOUNTER — Ambulatory Visit (INDEPENDENT_AMBULATORY_CARE_PROVIDER_SITE_OTHER): Payer: BLUE CROSS/BLUE SHIELD

## 2015-09-12 ENCOUNTER — Ambulatory Visit (INDEPENDENT_AMBULATORY_CARE_PROVIDER_SITE_OTHER): Payer: BLUE CROSS/BLUE SHIELD | Admitting: Family Medicine

## 2015-09-12 ENCOUNTER — Encounter: Payer: Self-pay | Admitting: Family Medicine

## 2015-09-12 VITALS — BP 118/74 | HR 73 | Temp 97.9°F | Wt 118.0 lb

## 2015-09-12 DIAGNOSIS — J988 Other specified respiratory disorders: Secondary | ICD-10-CM | POA: Diagnosis not present

## 2015-09-12 DIAGNOSIS — R05 Cough: Secondary | ICD-10-CM | POA: Diagnosis not present

## 2015-09-12 DIAGNOSIS — R059 Cough, unspecified: Secondary | ICD-10-CM

## 2015-09-12 MED ORDER — HYDROCOD POLST-CPM POLST ER 10-8 MG/5ML PO SUER: 5.0000 mL | Freq: Two times a day (BID) | ORAL | 0 refills | Status: DC | PRN

## 2015-09-12 NOTE — Progress Notes (Signed)
Pre visit review using our clinic review tool, if applicable. No additional management support is needed unless otherwise documented below in the visit note. 

## 2015-09-12 NOTE — Assessment & Plan Note (Signed)
New acute problem. Patient with a likely viral illness. Exam unremarkable. However, given her prior history of pneumonia and the fact that her lung exam was clear at that time I am obtaining a chest x-ray today to ensure no underlying pneumonia. Treating cough with Tussionex.

## 2015-09-12 NOTE — Patient Instructions (Signed)
I'll be in touch regarding your results.  Take care  Dr. Lacinda Axon

## 2015-09-12 NOTE — Progress Notes (Signed)
   Subjective:  Patient ID: Joy Edwards, female    DOB: [birth year]  Age: 60 y.o. MRN: [id number]  CC: ? Sinus infection  HPI:  60 year old female who has had several bouts of respiratory illnesses over the past year presents with the above complaint.  Patient reports productive cough and congestion since Monday. She reports associated chills. No fever. She's taking Mucinex without improvement. She reports recent sick contacts as her daughter and grandchild have had strep throat. No known exacerbating factors. No other complaints this time.   Social Hx   Social History   Social History  . Marital status: Married    Spouse name: N/A  . Number of children: N/A  . Years of education: N/A   Social History Main Topics  . Smoking status: Never Smoker  . Smokeless tobacco: Never Used  . Alcohol use Yes     Comment: wine  . Drug use: No  . Sexual activity: Not Asked   Other Topics Concern  . None   Social History Narrative  . None   Review of Systems  Constitutional: Positive for chills. Negative for fever.  HENT: Positive for congestion.   Respiratory: Positive for cough.    Objective:  BP 118/74 (BP Location: Right Arm, Patient Position: Sitting, Cuff Size: Normal)   Pulse 73   Temp 97.9 F (36.6 C) (Oral)   Wt 118 lb (53.5 kg)   SpO2 97%   BMI 19.64 kg/m   BP/Weight 09/12/2015 05/24/2015 [id number]  Systolic BP 123456 123456 [id number]  Diastolic BP 74 86 72  Wt. (Lbs) 118 117.5 118.8  BMI 19.64 19.55 19.77   Physical Exam  Constitutional: She is oriented to person, place, and time. She appears well-developed. No distress.  HENT:  Mouth/Throat: Oropharynx is clear and moist.  Normal TM's bilaterally.  Cardiovascular: Normal rate and regular rhythm.   Pulmonary/Chest: Effort normal. She has no wheezes. She has no rales.  Neurological: She is alert and oriented to person, place, and time.  Psychiatric: She has a normal mood and affect.  Vitals reviewed.  Lab Results    Component Value Date   WBC 4.9 03/27/2014   HGB 13.0 03/27/2014   HCT 37.9 03/27/2014   PLT 210.0 03/27/2014   GLUCOSE 76 03/27/2014   CHOL 177 03/27/2014   TRIG 64.0 03/27/2014   HDL 84.20 03/27/2014   LDLCALC 80 03/27/2014   ALT 31 03/27/2014   AST 27 03/27/2014   NA 139 03/27/2014   K 4.0 03/27/2014   CL 106 03/27/2014   CREATININE 0.86 03/27/2014   BUN 13 03/27/2014   CO2 27 03/27/2014   TSH 2.25 03/27/2014    Assessment & Plan:   Problem List Items Addressed This Visit    Respiratory infection - Primary    New acute problem. Patient with a likely viral illness. Exam unremarkable. However, given her prior history of pneumonia and the fact that her lung exam was clear at that time I am obtaining a chest x-ray today to ensure no underlying pneumonia. Treating cough with Tussionex.      Relevant Orders   DG Chest 2 View    Other Visit Diagnoses    Cough       Relevant Medications   chlorpheniramine-HYDROcodone (TUSSIONEX PENNKINETIC ER) 10-8 MG/5ML SUER     Follow-up: PRN  [place]

## 2015-09-20 ENCOUNTER — Telehealth: Payer: Self-pay | Admitting: Internal Medicine

## 2015-09-20 DIAGNOSIS — Z1239 Encounter for other screening for malignant neoplasm of breast: Secondary | ICD-10-CM

## 2015-09-20 NOTE — Telephone Encounter (Signed)
No nee, she is FOF   Will order

## 2015-09-20 NOTE — Telephone Encounter (Signed)
Patient has not had OV since 2/16 and no labs by PCP since 2/16 patient needs appointment for CPE and follow up I will order Mammo but please advise should patient be seen first.

## 2015-09-20 NOTE — Telephone Encounter (Signed)
Pt called wanting to get a order done to get a mammo. Pt would like to get mammo in [place] area. Please and thank you!  Call pt @ [phone].

## 2015-09-20 NOTE — Telephone Encounter (Signed)
fyi

## 2016-03-02 LAB — HM MAMMOGRAPHY

## 2016-03-05 ENCOUNTER — Encounter: Payer: Self-pay | Admitting: Internal Medicine

## 2016-03-11 ENCOUNTER — Encounter: Payer: Self-pay | Admitting: Internal Medicine

## 2016-03-11 ENCOUNTER — Other Ambulatory Visit: Payer: Self-pay | Admitting: Radiology

## 2016-03-11 ENCOUNTER — Telehealth: Payer: Self-pay | Admitting: Internal Medicine

## 2016-03-11 DIAGNOSIS — H9190 Unspecified hearing loss, unspecified ear: Secondary | ICD-10-CM

## 2016-03-11 DIAGNOSIS — M810 Age-related osteoporosis without current pathological fracture: Secondary | ICD-10-CM

## 2016-03-11 NOTE — Telephone Encounter (Signed)
Joy Edwards has reported that Her mammogram and ultrasound was abnormal on the left .  Have they recommended to her that she get a biopsy , and does she want to discuss ?

## 2016-03-11 NOTE — Telephone Encounter (Signed)
Spoke with patient states she  had biopsy today. She will contact us if needs to discuss.

## 2016-03-30 NOTE — Telephone Encounter (Signed)
Pt lvm stating that she needs her bone density ordered. She goes to [facility]. She also request a referral to ENT on [address] for hearing aids.  Pt cb [phone]

## 2016-03-31 NOTE — Telephone Encounter (Signed)
Does she [facility]? Pleas clarify  DEXA ordered

## 2016-03-31 NOTE — Telephone Encounter (Signed)
LOV w/ you: 03/27/14 No existing appts Please advise

## 2016-04-02 NOTE — Telephone Encounter (Signed)
LMTCB

## 2016-04-02 NOTE — Telephone Encounter (Signed)
Melissa,  Not sure if there are multiple ENT groups on [address] in [place].  If there are,  Let me know and I'll ask ms Balan if she has a preference.

## 2016-04-02 NOTE — Telephone Encounter (Signed)
Pt stated that she needs a referral to an ENT on [address] in [place].

## 2016-04-07 NOTE — Telephone Encounter (Signed)
She was referred to [facility] ENT with Dr. Alycia Patten. LVM for her to return my call so I can inform her of her appt.

## 2016-04-20 DIAGNOSIS — H9113 Presbycusis, bilateral: Secondary | ICD-10-CM | POA: Insufficient documentation

## 2016-04-20 DIAGNOSIS — IMO0002 Reserved for concepts with insufficient information to code with codable children: Secondary | ICD-10-CM | POA: Insufficient documentation

## 2016-04-20 DIAGNOSIS — H9313 Tinnitus, bilateral: Secondary | ICD-10-CM | POA: Insufficient documentation

## 2016-05-08 ENCOUNTER — Telehealth: Payer: Self-pay | Admitting: Internal Medicine

## 2016-05-08 DIAGNOSIS — M810 Age-related osteoporosis without current pathological fracture: Secondary | ICD-10-CM

## 2016-05-08 NOTE — Telephone Encounter (Signed)
MyChart message sent  Re D#EXA scan   

## 2016-07-10 ENCOUNTER — Telehealth: Payer: Self-pay | Admitting: Internal Medicine

## 2016-07-10 MED ORDER — PREDNISONE 10 MG PO TABS: ORAL_TABLET | ORAL | 0 refills | Status: DC

## 2016-07-10 MED ORDER — TRIAMCINOLONE ACETONIDE 0.1 % EX CREA: 1.0000 "application " | TOPICAL_CREAM | Freq: Two times a day (BID) | CUTANEOUS | 0 refills | Status: DC

## 2016-07-10 NOTE — Telephone Encounter (Signed)
Pt called and stated that she has poison oak for about a week and has gotten worse. She has it on her chin, neck and arms, she has tried everything OTC and nothing has helped. Please advise, thank you!  Call pt @ [phone]  Pharmacy - [id number] - [place], [place]

## 2016-07-10 NOTE — Telephone Encounter (Signed)
Why am I not getting these messages sooner?  This message came in at 8:40 and was not in my box at lunchtime!  I will call in prednisone  To take for the next 8 days,  And triamcinolone ointment. seh can take benadryl for itching.

## 2016-07-10 NOTE — Telephone Encounter (Signed)
Reason for call:poison oak  Symptoms: face , near left eye,  neck , chest, hands,  Duration 1 week  Medications:Iva Rest, Benadryl spray, Benadryl tablets,  Last seen for this problem: Seen by:  Please advise, prefers not going to urgent care, due to has granddaughter

## 2016-07-10 NOTE — Telephone Encounter (Signed)
Patient advised of below and verbalized and understanding  

## 2016-07-10 NOTE — Telephone Encounter (Signed)
Patient called back wanting a response today as to what she should do regarding her having poison oak.

## 2016-08-17 ENCOUNTER — Encounter: Payer: Self-pay | Admitting: Family Medicine

## 2016-08-17 ENCOUNTER — Ambulatory Visit (INDEPENDENT_AMBULATORY_CARE_PROVIDER_SITE_OTHER): Payer: BLUE CROSS/BLUE SHIELD | Admitting: Family Medicine

## 2016-08-17 DIAGNOSIS — J069 Acute upper respiratory infection, unspecified: Secondary | ICD-10-CM | POA: Diagnosis not present

## 2016-08-17 MED ORDER — BENZONATATE 100 MG PO CAPS: 100.0000 mg | ORAL_CAPSULE | Freq: Three times a day (TID) | ORAL | 0 refills | Status: DC | PRN

## 2016-08-17 NOTE — Patient Instructions (Addendum)
This is viral. Your exam was reassuring.   Flonase for congestion. Antihistamine for the postnasal drip (Zyrtec, Claritin, Allegra, or Xyzal).  Use the cough medication as needed.  Give it some time. Rest. You should be improving by the end of the week.  Take care  Dr. Lacinda Axon    Upper Respiratory Infection, Adult Most upper respiratory infections (URIs) are a viral infection of the air passages leading to the lungs. A URI affects the nose, throat, and upper air passages. The most common type of URI is nasopharyngitis and is typically referred to as "the common cold." URIs run their course and usually go away on their own. Most of the time, a URI does not require medical attention, but sometimes a bacterial infection in the upper airways can follow a viral infection. This is called a secondary infection. Sinus and middle ear infections are common types of secondary upper respiratory infections. Bacterial pneumonia can also complicate a URI. A URI can worsen asthma and chronic obstructive pulmonary disease (COPD). Sometimes, these complications can require emergency medical care and may be life threatening. What are the causes? Almost all URIs are caused by viruses. A virus is a type of germ and can spread from one person to another. What increases the risk? You may be at risk for a URI if:  You smoke.  You have chronic heart or lung disease.  You have a weakened defense (immune) system.  You are very young or very old.  You have nasal allergies or asthma.  You work in crowded or poorly ventilated areas.  You work in health care facilities or schools.  What are the signs or symptoms? Symptoms typically develop 2-3 days after you come in contact with a cold virus. Most viral URIs last 7-10 days. However, viral URIs from the influenza virus (flu virus) can last 14-18 days and are typically more severe. Symptoms may include:  Runny or stuffy (congested)  nose.  Sneezing.  Cough.  Sore throat.  Headache.  Fatigue.  Fever.  Loss of appetite.  Pain in your forehead, behind your eyes, and over your cheekbones (sinus pain).  Muscle aches.  How is this diagnosed? Your health care provider may diagnose a URI by:  Physical exam.  Tests to check that your symptoms are not due to another condition such as: ? Strep throat. ? Sinusitis. ? Pneumonia. ? Asthma.  How is this treated? A URI goes away on its own with time. It cannot be cured with medicines, but medicines may be prescribed or recommended to relieve symptoms. Medicines may help:  Reduce your fever.  Reduce your cough.  Relieve nasal congestion.  Follow these instructions at home:  Take medicines only as directed by your health care provider.  Gargle warm saltwater or take cough drops to comfort your throat as directed by your health care provider.  Use a warm mist humidifier or inhale steam from a shower to increase air moisture. This may make it easier to breathe.  Drink enough fluid to keep your urine clear or pale yellow.  Eat soups and other clear broths and maintain good nutrition.  Rest as needed.  Return to work when your temperature has returned to normal or as your health care provider advises. You may need to stay home longer to avoid infecting others. You can also use a face mask and careful hand washing to prevent spread of the virus.  Increase the usage of your inhaler if you have asthma.  Do not  use any tobacco products, including cigarettes, chewing tobacco, or electronic cigarettes. If you need help quitting, ask your health care provider. How is this prevented? The best way to protect yourself from getting a cold is to practice good hygiene.  Avoid oral or hand contact with people with cold symptoms.  Wash your hands often if contact occurs.  There is no clear evidence that vitamin C, vitamin E, echinacea, or exercise reduces the chance  of developing a cold. However, it is always recommended to get plenty of rest, exercise, and practice good nutrition. Contact a health care provider if:  You are getting worse rather than better.  Your symptoms are not controlled by medicine.  You have chills.  You have worsening shortness of breath.  You have brown or red mucus.  You have yellow or brown nasal discharge.  You have pain in your face, especially when you bend forward.  You have a fever.  You have swollen neck glands.  You have pain while swallowing.  You have white areas in the back of your throat. Get help right away if:  You have severe or persistent: ? Headache. ? Ear pain. ? Sinus pain. ? Chest pain.  You have chronic lung disease and any of the following: ? Wheezing. ? Prolonged cough. ? Coughing up blood. ? A change in your usual mucus.  You have a stiff neck.  You have changes in your: ? Vision. ? Hearing. ? Thinking. ? Mood. This information is not intended to replace advice given to you by your health care provider. Make sure you discuss any questions you have with your health care provider. Document Released: 07/15/2000 Document Revised: 09/22/2015 Document Reviewed: 04/26/2013 Elsevier Interactive Patient Education  2017 [employer].

## 2016-08-17 NOTE — Assessment & Plan Note (Signed)
New problem. Patient is experiencing a viral infection. No indication for antibiotic. Tessalon for cough. Advised Flonase, Zyrtec.

## 2016-08-17 NOTE — Progress Notes (Signed)
Subjective:  Patient ID: Joy Edwards, female    DOB: 1955-06-10  Age: 61 y.o. MRN: [id number]  CC: URI  HPI:  61 year old female presents with upper respiratory symptoms.  Patient reports that she has been sick since Friday. She's had a recent sick contact (22-year-old granddaughter). She has been experiencing sore throat, postnasal drip, fatigue, cough. Symptoms are severe per her report. She also reports chills. No fever. She's been taking over-the-counter Tylenol, DayQuil without improvement. No known exacerbating factors. No other associated symptoms. No other complaints or concerns at this time.  Social Hx   Social History   Social History  . Marital status: Married    Spouse name: N/A  . Number of children: N/A  . Years of education: N/A   Social History Main Topics  . Smoking status: Never Smoker  . Smokeless tobacco: Never Used  . Alcohol use Yes     Comment: wine  . Drug use: No  . Sexual activity: Not Asked   Other Topics Concern  . None   Social History Narrative  . None    Review of Systems  Constitutional: Positive for chills and fatigue. Negative for fever.  HENT: Positive for congestion, postnasal drip and sore throat.   Respiratory: Positive for cough.    Objective:  BP 118/72   Pulse 86   Temp 98.3 F (36.8 C) (Oral)   Wt 119 lb (54 kg)   SpO2 98%   BMI 19.80 kg/m   BP/Weight 08/17/2016 09/12/2015 2/69/4854  Systolic BP 627 035 009  Diastolic BP 72 74 86  Wt. (Lbs) 119 118 117.5  BMI 19.8 19.64 19.55    Physical Exam  Constitutional: She is oriented to person, place, and time. She appears well-developed. No distress.  HENT:  Head: Normocephalic and atraumatic.  Mouth/Throat: Oropharynx is clear and moist.  Normal TMs bilaterally.  Eyes: Conjunctivae are normal. Right eye exhibits no discharge. Left eye exhibits no discharge.  Neck: Neck supple.  Cardiovascular: Normal rate and regular rhythm.   No murmur heard. Pulmonary/Chest:  Effort normal. No respiratory distress. She has no wheezes. She has no rales.  Lymphadenopathy:    She has no cervical adenopathy.  Neurological: She is alert and oriented to person, place, and time.  Psychiatric: She has a normal mood and affect.  Vitals reviewed.   Lab Results  Component Value Date   WBC 4.9 03/27/2014   HGB 13.0 03/27/2014   HCT 37.9 03/27/2014   PLT 210.0 03/27/2014   GLUCOSE 76 03/27/2014   CHOL 177 03/27/2014   TRIG 64.0 03/27/2014   HDL 84.20 03/27/2014   LDLCALC 80 03/27/2014   ALT 31 03/27/2014   AST 27 03/27/2014   NA 139 03/27/2014   K 4.0 03/27/2014   CL 106 03/27/2014   CREATININE 0.86 03/27/2014   BUN 13 03/27/2014   CO2 27 03/27/2014   TSH 2.25 03/27/2014    Assessment & Plan:   Problem List Items Addressed This Visit      Respiratory   URI (upper respiratory infection)    New problem. Patient is experiencing a viral infection. No indication for antibiotic. Tessalon for cough. Advised Flonase, Zyrtec.        Meds ordered this encounter  Medications  . benzonatate (TESSALON) 100 MG capsule    Sig: Take 1 capsule (100 mg total) by mouth 3 (three) times daily as needed.    Dispense:  30 capsule    Refill:  0  Follow-up: PRN  [place]

## 2016-08-19 ENCOUNTER — Other Ambulatory Visit: Payer: Self-pay | Admitting: Family Medicine

## 2016-08-19 ENCOUNTER — Telehealth: Payer: Self-pay | Admitting: Internal Medicine

## 2016-08-19 MED ORDER — DOXYCYCLINE HYCLATE 100 MG PO TABS: 100.0000 mg | ORAL_TABLET | Freq: Two times a day (BID) | ORAL | 0 refills | Status: DC

## 2016-08-19 NOTE — Progress Notes (Signed)
doxy 

## 2016-08-19 NOTE — Telephone Encounter (Signed)
Reason for call: facial pain  Symptoms: teeth pain , facial pain, eyes hurting, chills, nasal mucus now green and yellow, hoarse  Duration 08/17/16 Medications:flonase, zyrtec, bezonatate Last seen for this problem:08/17/16 Seen by: Marsa Aris

## 2016-08-19 NOTE — Telephone Encounter (Signed)
Patient advised of below and verbalized understanding.  

## 2016-08-19 NOTE — Telephone Encounter (Signed)
Rx sent for antibiotic

## 2016-08-19 NOTE — Telephone Encounter (Signed)
Pt called and stated that she is still not feeling any better. Pt states now she is having teeth pain, and eyes hurting,. Still green and yellow mucous. Please advise, thank you!  Call pt @ [phone]  Pharmacy - [id number] - [place], [place]

## 2016-09-08 ENCOUNTER — Encounter: Payer: Self-pay | Admitting: Internal Medicine

## 2016-10-28 NOTE — Telephone Encounter (Signed)
order

## 2017-02-04 ENCOUNTER — Ambulatory Visit: Payer: BLUE CROSS/BLUE SHIELD | Admitting: Primary Care

## 2017-02-04 ENCOUNTER — Encounter: Payer: Self-pay | Admitting: Primary Care

## 2017-02-04 VITALS — BP 100/64 | HR 61 | Temp 98.7°F | Wt 119.4 lb

## 2017-02-04 DIAGNOSIS — J069 Acute upper respiratory infection, unspecified: Secondary | ICD-10-CM | POA: Diagnosis not present

## 2017-02-04 MED ORDER — GUAIFENESIN-CODEINE 100-10 MG/5ML PO SYRP: 5.0000 mL | ORAL_SOLUTION | Freq: Every evening | ORAL | 0 refills | Status: DC | PRN

## 2017-02-04 MED ORDER — AZITHROMYCIN 250 MG PO TABS: ORAL_TABLET | ORAL | 0 refills | Status: DC

## 2017-02-04 NOTE — Progress Notes (Signed)
Subjective:    Patient ID: Joy Edwards, female    DOB: [birth year], 62 y.o.   MRN: [id number]  HPI  Joy Edwards is a 62 year old female with a history of pneumonia x 2 who presents today with a chief complaint of cough. Joy Edwards also reports shortness of breath, nasal congestion, sinus pressure. Her symptoms began about 10 days ago. Joy Edwards's taken Dayquil, Nyquil, Mucinex without much improvement. Joy Edwards denies sick contacts but does watch her granddaughter often. Overall Joy Edwards's feeling about the same.   Review of Systems  Constitutional: Positive for chills and fatigue. Negative for fever.  HENT: Positive for congestion.   Respiratory: Positive for cough and shortness of breath.        Past Medical History:  Diagnosis Date  . Cancer ([place])   . Depression   . Osteoporosis      Social History   Socioeconomic History  . Marital status: Married    Spouse name: Not on file  . Number of children: Not on file  . Years of education: Not on file  . Highest education level: Not on file  Social Needs  . Financial resource strain: Not on file  . Food insecurity - worry: Not on file  . Food insecurity - inability: Not on file  . Transportation needs - medical: Not on file  . Transportation needs - non-medical: Not on file  Occupational History  . Not on file  Tobacco Use  . Smoking status: Never Smoker  . Smokeless tobacco: Never Used  Substance and Sexual Activity  . Alcohol use: Yes    Comment: wine  . Drug use: No  . Sexual activity: Not on file  Other Topics Concern  . Not on file  Social History Narrative  . Not on file    Past Surgical History:  Procedure Laterality Date  . APPENDECTOMY      Family History  Problem Relation Age of Onset  . Alcohol abuse Father   . Alcohol abuse Sister     Allergies  Allergen Reactions  . Penicillins Hives    Current Outpatient Medications on File Prior to Visit  Medication Sig Dispense Refill  . buPROPion (WELLBUTRIN XL) 300 MG  24 hr tablet Take 300 mg by mouth daily.      . sertraline (ZOLOFT) 25 MG tablet Take 25 mg by mouth daily.       No current facility-administered medications on file prior to visit.     BP 100/64   Pulse 61   Temp 98.7 F (37.1 C) (Oral)   Wt 119 lb 6.4 oz (54.2 kg)   SpO2 98%   BMI 19.87 kg/m    Objective:   Physical Exam  Constitutional: Joy Edwards appears well-nourished. Joy Edwards appears ill.  HENT:  Right Ear: Tympanic membrane and ear canal normal.  Left Ear: Tympanic membrane and ear canal normal.  Nose: Right sinus exhibits no maxillary sinus tenderness and no frontal sinus tenderness. Left sinus exhibits no maxillary sinus tenderness and no frontal sinus tenderness.  Mouth/Throat: Oropharynx is clear and moist.  Eyes: Conjunctivae are normal.  Neck: Neck supple.  Cardiovascular: Normal rate and regular rhythm.  Pulmonary/Chest: Effort normal and breath sounds normal. Joy Edwards has no decreased breath sounds. Joy Edwards has no wheezes. Joy Edwards has no rales.  Persistent congestive cough during exam.  Lymphadenopathy:    Joy Edwards has no cervical adenopathy.  Skin: Skin is warm and dry.          Assessment & Plan:  URI:  Cough, congestion, chills x 10 days, no improvement with time and OTC treatment. Exam today overall clear lungs, Joy Edwards is withholding deep breaths to prevent coughing. Joy Edwards does appear ill. Vials stable. Cough during exam is moderate to severe intensity. Given cough, presentation, and history of pneumonia, will treat. Rx for Zpak sent to pharmacy. Rx for Cheratussin printed for HS.  Fluids, rest, follow up PRN.  Sheral Flow, NP

## 2017-02-04 NOTE — Patient Instructions (Signed)
Start Azithromycin antibiotics for infection. Take 2 tablets by mouth today, then 1 tablet daily for 4 additional days.  You may take the cough suppressant at bedtime as needed for cough and rest. Caution this medication contains codeine and will make you feel drowsy.  Ensure you stay hydrated with water and rest.  It was a pleasure meeting you!

## 2017-05-02 ENCOUNTER — Telehealth: Payer: Self-pay | Admitting: Internal Medicine

## 2017-05-02 DIAGNOSIS — Z13220 Encounter for screening for lipoid disorders: Secondary | ICD-10-CM

## 2017-05-02 DIAGNOSIS — L603 Nail dystrophy: Secondary | ICD-10-CM

## 2017-05-02 NOTE — Telephone Encounter (Signed)
Patient is overdue for annual exam , pap smear and colonoscopy.  I have ordered fasting labs. Please offer her a Thursday appointment at 12:00 any Thursday but April 11

## 2017-05-04 NOTE — Telephone Encounter (Signed)
Will you please schedule pt for a physical with Dr. Derrel Nip on 05/20/2017 @ 12:00pm per Dr. Derrel Nip. Pt is aware of appt date and time. Lab appt has already been scheduled and pt is aware of that appt as well.

## 2017-05-04 NOTE — Telephone Encounter (Signed)
LMTCB. Please transfer pt to our office.  

## 2017-05-05 NOTE — Telephone Encounter (Signed)
Scheduled

## 2017-05-10 ENCOUNTER — Encounter: Payer: Self-pay | Admitting: Internal Medicine

## 2017-05-10 DIAGNOSIS — Z85828 Personal history of other malignant neoplasm of skin: Secondary | ICD-10-CM | POA: Insufficient documentation

## 2017-05-10 DIAGNOSIS — L608 Other nail disorders: Secondary | ICD-10-CM | POA: Insufficient documentation

## 2017-05-12 ENCOUNTER — Other Ambulatory Visit (INDEPENDENT_AMBULATORY_CARE_PROVIDER_SITE_OTHER): Payer: BLUE CROSS/BLUE SHIELD

## 2017-05-12 DIAGNOSIS — Z13220 Encounter for screening for lipoid disorders: Secondary | ICD-10-CM

## 2017-05-12 DIAGNOSIS — L603 Nail dystrophy: Secondary | ICD-10-CM | POA: Diagnosis not present

## 2017-05-12 LAB — COMPREHENSIVE METABOLIC PANEL
ALT: 25 U/L (ref 0–35)
AST: 26 U/L (ref 0–37)
Albumin: 4.5 g/dL (ref 3.5–5.2)
Alkaline Phosphatase: 60 U/L (ref 39–117)
BUN: 13 mg/dL (ref 6–23)
CO2: 30 mEq/L (ref 19–32)
Calcium: 9.3 mg/dL (ref 8.4–10.5)
Chloride: 100 mEq/L (ref 96–112)
Creatinine, Ser: 0.75 mg/dL (ref 0.40–1.20)
GFR: 82.72 mL/min (ref >60.00)
Glucose, Bld: 103 mg/dL — ABNORMAL HIGH (ref 70–99)
Potassium: 3.7 mEq/L (ref 3.5–5.1)
Sodium: 137 mEq/L (ref 135–145)
Total Bilirubin: 0.5 mg/dL (ref 0.2–1.2)
Total Protein: 7.3 g/dL (ref 6.0–8.3)

## 2017-05-12 LAB — LIPID PANEL
Cholesterol: 186 mg/dL (ref 0–200)
HDL: 91.1 mg/dL (ref >39.00)
LDL Cholesterol: 85 mg/dL (ref 0–99)
NonHDL: 94.93
Total CHOL/HDL Ratio: 2
Triglycerides: 48 mg/dL (ref 0.0–149.0)
VLDL: 9.6 mg/dL (ref 0.0–40.0)

## 2017-05-12 LAB — TSH: TSH: 2.42 u[IU]/mL (ref 0.35–4.50)

## 2017-05-12 LAB — SEDIMENTATION RATE: Sed Rate: 13 mm/hr (ref 0–30)

## 2017-05-12 LAB — VITAMIN D 25 HYDROXY (VIT D DEFICIENCY, FRACTURES): VITD: 41.76 ng/mL (ref 30.00–100.00)

## 2017-05-13 NOTE — Addendum Note (Signed)
Addended by: Leeanne Rio on: 05/13/2017 02:44 PM   Modules accepted: Orders

## 2017-05-14 LAB — ZINC: Zinc: 82 ug/dL (ref 60–130)

## 2017-05-16 ENCOUNTER — Encounter: Payer: Self-pay | Admitting: Internal Medicine

## 2017-05-18 LAB — HEAVY METALS PROFILE, URINE
Arsenic, 24H Ur: 28 mcg/L (ref <=80)
Lead, 24 hr urine: <10 mcg/L (ref <80)
Mercury, 24H Ur: <4 mcg/L (ref <=20)

## 2017-05-20 ENCOUNTER — Encounter: Payer: Self-pay | Admitting: Internal Medicine

## 2017-05-20 ENCOUNTER — Ambulatory Visit (INDEPENDENT_AMBULATORY_CARE_PROVIDER_SITE_OTHER): Payer: BLUE CROSS/BLUE SHIELD | Admitting: Internal Medicine

## 2017-05-20 ENCOUNTER — Other Ambulatory Visit (HOSPITAL_COMMUNITY)
Admission: RE | Admit: 2017-05-20 | Discharge: 2017-05-20 | Disposition: A | Discharge status: Home / Self Care | Payer: BLUE CROSS/BLUE SHIELD | Source: Ambulatory Visit | Attending: Internal Medicine | Admitting: Internal Medicine

## 2017-05-20 VITALS — BP 98/72 | HR 77 | Temp 97.6°F | Resp 14 | Ht 65.0 in | Wt 117.6 lb

## 2017-05-20 DIAGNOSIS — Z1211 Encounter for screening for malignant neoplasm of colon: Secondary | ICD-10-CM

## 2017-05-20 DIAGNOSIS — Z124 Encounter for screening for malignant neoplasm of cervix: Secondary | ICD-10-CM

## 2017-05-20 DIAGNOSIS — Z0000 Encounter for general adult medical examination without abnormal findings: Secondary | ICD-10-CM | POA: Diagnosis not present

## 2017-05-20 NOTE — Progress Notes (Signed)
Patient ID: Joy Edwards, female    DOB: 05/16/1955  Age: 62 y.o. MRN: [id number]  The patient is here for annual preventive examination and management of other chronic and acute problems.   The risk factors are reflected in the social history.  The roster of all physicians providing medical care to patient - is listed in the Snapshot section of the chart.  Activities of daily living:  The patient is 100% independent in all ADLs: dressing, toileting, feeding as well as independent mobility  Home safety : The patient has smoke detectors in the home. They wear seatbelts.  There are secured firearms at home. There is no violence in the home.   There is no risks for hepatitis, STDs or HIV. There is no   history of blood transfusion. They have no travel history to infectious disease endemic areas of the world.  The patient has seen their dentist in the last six month. They have seen their eye doctor in the last year.   They have a history of   have excessive sun exposure and sees her dermatologist every 6 months .  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 . Discussed the need for sun protection: hats, long sleeves and use of sunscreen if there is significant sun exposure.   Diet: the importance of a healthy diet is discussed. They do have a healthy diet.  The benefits of regular aerobic exercise were discussed. She exercises vigorously  5 times per week .   Depression screen: there are no signs or vegative symptoms of depression- irritability, change in appetite, anhedonia, sadness/tearfullness.  The following portions of the patient's history were reviewed and updated as appropriate: allergies, current medications, past family history, past medical history,  past surgical history, past social history  and problem list.  Visual acuity was not assessed per patient preference since she has regular follow up with her ophthalmologist. Hearing and body mass index were assessed  and reviewed.   During the course of the visit the patient was educated and counseled about appropriate screening and preventive services including : fall prevention , diabetes screening, nutrition counseling, colorectal cancer screening, and recommended immunizations.    CC: The primary encounter diagnosis was Cervical cancer screening. Diagnoses of Colon cancer screening and Routine general medical examination at a health care facility were also pertinent to this visit.   [name] has a past medical history of Cancer ([place]), Depression, and Osteoporosis.   She has a past surgical history that includes Appendectomy.   Her family history includes Alcohol abuse in her father and sister.She reports that she has never smoked. She has never used smokeless tobacco. She reports that she drinks about 4.2 oz of alcohol per week. She reports that she does not use drugs.  Outpatient Medications Prior to Visit  Medication Sig Dispense Refill  . buPROPion (WELLBUTRIN XL) 300 MG 24 hr tablet Take 300 mg by mouth daily.      . sertraline (ZOLOFT) 25 MG tablet Take 25 mg by mouth daily.      Marland Kitchen azithromycin (ZITHROMAX) 250 MG tablet Take 2 tablets by mouth today, then 1 tablet daily for 4 additional days. (Patient not taking: Reported on 05/20/2017) 6 tablet 0  . guaiFENesin-codeine (ROBITUSSIN AC) 100-10 MG/5ML syrup Take 5 mLs by mouth at bedtime as needed for cough. (Patient not taking: Reported on 05/20/2017) 50 mL 0   No facility-administered medications prior to visit.     Review of  Systems   Patient denies headache, fevers, malaise, unintentional weight loss, skin rash, eye pain, sinus congestion and sinus pain, sore throat, dysphagia,  hemoptysis , cough, dyspnea, wheezing, chest pain, palpitations, orthopnea, edema, abdominal pain, nausea, melena, diarrhea, constipation, flank pain, dysuria, hematuria, urinary  Frequency, nocturia, numbness, tingling, seizures,  Focal weakness, Loss of  consciousness,  Tremor, insomnia, depression, anxiety, and suicidal ideation.     Objective:  BP 98/72 (BP Location: Left Arm, Patient Position: Sitting, Cuff Size: Normal)   Pulse 77   Temp 97.6 F (36.4 C) (Oral)   Resp 14   Ht 5\' 5"  (1.651 m)   Wt 117 lb 9.6 oz (53.3 kg)   SpO2 97%   BMI 19.57 kg/m   Physical Exam   General Appearance:    Alert, cooperative, no distress, appears stated age  Head:    Normocephalic, without obvious abnormality, atraumatic  Eyes:    PERRL, conjunctiva/corneas clear, EOM's intact, fundi    benign, both eyes  Ears:    Normal TM's and external ear canals, both ears  Nose:   Nares normal, septum midline, mucosa normal, no drainage    or sinus tenderness  Throat:   Lips, mucosa, and tongue normal; teeth and gums normal  Neck:   Supple, symmetrical, trachea midline, no adenopathy;    thyroid:  no enlargement/tenderness/nodules; no carotid   bruit or JVD  Back:     Symmetric, no curvature, ROM normal, no CVA tenderness  Lungs:     Clear to auscultation bilaterally, respirations unlabored  Chest Wall:    No tenderness or deformity   Heart:    Regular rate and rhythm, S1 and S2 normal, no murmur, rub   or gallop  Breast Exam:    No tenderness, masses, or nipple abnormality  Abdomen:     Soft, non-tender, bowel sounds active all four quadrants,    no masses, no organomegaly  Genitalia:    Pelvic: cervix normal in appearance, external genitalia normal, no adnexal masses or tenderness, no cervical motion tenderness, rectovaginal septum normal, uterus normal size, shape, and consistency and vagina normal without discharge  Extremities:   Extremities normal, atraumatic, no cyanosis or edema  Pulses:   2+ and symmetric all extremities  Skin:   Skin color, texture, turgor normal, no rashes or lesions  Lymph nodes:   Cervical, supraclavicular, and axillary nodes normal  Neurologic:   CNII-XII intact, normal strength, sensation and reflexes    throughout       Assessment & Plan:   Problem List Items Addressed This Visit    Routine general medical examination at a health care facility    Annual comprehensive preventive exam was done as well as an evaluation and management of chronic conditions .  During the course of the visit the patient was educated and counseled about appropriate screening and preventive services including :  diabetes screening, lipid analysis with projected  10 year  risk for CAD , nutrition counseling, breast, cervical and colorectal cancer screening, and recommended immunizations.  Printed recommendations for health maintenance screenings was given       Other Visit Diagnoses    Cervical cancer screening    -  Primary   Relevant Orders   Cytology - PAP   Colon cancer screening       Relevant Orders   Ambulatory referral to Gastroenterology      I have discontinued Patti B. Tetrick's azithromycin and guaiFENesin-codeine. I am also having her maintain her sertraline and  buPROPion.  No orders of the defined types were placed in this encounter.   Medications Discontinued During This Encounter  Medication Reason  . azithromycin (ZITHROMAX) 250 MG tablet Completed Course  . guaiFENesin-codeine (ROBITUSSIN AC) 100-10 MG/5ML syrup Completed Course    Follow-up: No follow-ups on file.   Crecencio Mc, MD

## 2017-05-20 NOTE — Assessment & Plan Note (Signed)
Annual comprehensive preventive exam was done as well as an evaluation and management of chronic conditions .  During the course of the visit the patient was educated and counseled about appropriate screening and preventive services including :  diabetes screening, lipid analysis with projected  10 year  risk for CAD , nutrition counseling, breast, cervical and colorectal cancer screening, and recommended immunizations.  Printed recommendations for health maintenance screenings was given 

## 2017-05-26 LAB — CYTOLOGY - PAP
Diagnosis: NEGATIVE
HPV: NOT DETECTED

## 2017-07-28 LAB — HM COLONOSCOPY

## 2017-08-18 ENCOUNTER — Encounter: Payer: Self-pay | Admitting: Internal Medicine

## 2017-12-21 ENCOUNTER — Encounter: Payer: Self-pay | Admitting: Family Medicine

## 2017-12-21 ENCOUNTER — Ambulatory Visit: Payer: BLUE CROSS/BLUE SHIELD | Admitting: Family Medicine

## 2017-12-21 VITALS — BP 104/62 | HR 72 | Temp 98.1°F | Ht 65.0 in | Wt 122.8 lb

## 2017-12-21 DIAGNOSIS — R059 Cough, unspecified: Secondary | ICD-10-CM

## 2017-12-21 DIAGNOSIS — R05 Cough: Secondary | ICD-10-CM | POA: Diagnosis not present

## 2017-12-21 DIAGNOSIS — J3489 Other specified disorders of nose and nasal sinuses: Secondary | ICD-10-CM

## 2017-12-21 DIAGNOSIS — J019 Acute sinusitis, unspecified: Secondary | ICD-10-CM

## 2017-12-21 MED ORDER — AMOXICILLIN-POT CLAVULANATE 875-125 MG PO TABS: 1.0000 | ORAL_TABLET | Freq: Two times a day (BID) | ORAL | 0 refills | Status: DC

## 2017-12-21 NOTE — Patient Instructions (Signed)

## 2017-12-21 NOTE — Progress Notes (Signed)
   Subjective:    Patient ID: Joy Edwards, female    DOB: 04/13/1955, 62 y.o.   MRN: [id number]  HPI  Presents to clinic c/o head congestion, sinus pain, pressure in nose/around eyes and cough for over 10 days.  She has been using Mucinex and also has been doing Nettie pot saline nasal rinses 1-2 times per day without relief in symptoms.  Patient states it is painful for her classes to even rest on her nose.  Denies fever or chills.  Denies shortness of breath or wheezing.  Denies nausea, vomiting or diarrhea.  Patient Active Problem List   Diagnosis Date Noted  . 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 History of nonmelanoma skin cancer 05/10/2017  . URI (upper respiratory infection) 08/17/2016  . Respiratory infection 05/25/2015  . Vitamin D deficiency 03/29/2014  . Routine general medical examination at a health care facility 06/30/2012  . Insomnia secondary to depression with anxiety 06/30/2012  . Depression   . Post-menopausal osteoporosis    Social History   Tobacco Use  . Smoking status: Never Smoker  . Smokeless tobacco: Never Used  Substance Use Topics  . Alcohol use: Yes    Alcohol/week: 7.0 standard drinks    Types: 7 Glasses of wine per week    Comment: wine    Review of Systems  Constitutional: Negative for chills, fatigue and fever.  HENT: +congestion, sinus pain/pressure.   Eyes: Negative.   Respiratory: +cough. Negative for shortness of breath and wheezing.   Cardiovascular: Negative for chest pain, palpitations and leg swelling.  Gastrointestinal: Negative for abdominal pain, diarrhea, nausea and vomiting.  Genitourinary: Negative for dysuria, frequency and urgency.  Musculoskeletal: Negative for arthralgias and myalgias.  Skin: Negative for color change, pallor and rash.  Neurological: Negative for syncope, light-headedness and headaches.  Psychiatric/Behavioral: The patient is not nervous/anxious.       Objective:   Physical Exam  Constitutional:  She is oriented to person, place, and time. No distress.  HENT:  Nose: Mucosal edema, rhinorrhea and sinus tenderness present. Right sinus exhibits maxillary sinus tenderness and frontal sinus tenderness. Left sinus exhibits maxillary sinus tenderness and frontal sinus tenderness.  +fullness bilateral TMs  Eyes: Conjunctivae and EOM are normal. No scleral icterus.  Neck: Neck supple. No JVD present. No tracheal deviation present.  Cardiovascular: Normal rate and regular rhythm.  Pulmonary/Chest: Effort normal and breath sounds normal. No respiratory distress.  Neurological: She is alert and oriented to person, place, and time.  Skin: Skin is warm and dry. She is not diaphoretic. No pallor.  Psychiatric: She has a normal mood and affect. Her behavior is normal.  Nursing note and vitals reviewed.     Vitals:   12/21/17 1000  BP: 104/62  Pulse: 72  Temp: 98.1 F (36.7 C)  SpO2: 95%   Assessment & Plan:   Acute sinusitis, sinus pain,cough- patient will take Augmentin twice daily for 10 days.  Advised she can continue to use Nettie pot saline rinses as these can help wash out congestion.  Also advised that she can continue to use Mucinex to help calm cough because Mucinex not only suppress his cough but can also help thin secretions.  Advised to rest, do good handwashing, increase fluid intake.  Keep regularly scheduled follow-up with PCP as planned.  Return to clinic sooner if issues arise or if current symptoms persist or worsen.

## 2018-01-30 ENCOUNTER — Other Ambulatory Visit: Payer: Self-pay | Admitting: Internal Medicine

## 2018-01-30 DIAGNOSIS — J019 Acute sinusitis, unspecified: Secondary | ICD-10-CM

## 2018-01-30 MED ORDER — AMOXICILLIN-POT CLAVULANATE 875-125 MG PO TABS: 1.0000 | ORAL_TABLET | Freq: Two times a day (BID) | ORAL | 0 refills | Status: DC

## 2018-01-30 MED ORDER — HYDROCOD POLST-CPM POLST ER 10-8 MG/5ML PO SUER: 5.0000 mL | Freq: Every evening | ORAL | 0 refills | Status: DC | PRN

## 2018-01-30 MED ORDER — PREDNISONE 10 MG PO TABS
ORAL_TABLET | ORAL | 0 refills | Status: DC
Start: 2018-01-30 — End: 2018-10-29

## 2018-01-30 NOTE — Assessment & Plan Note (Signed)
With cough.  Symptoms present for over one week andsputum now purulent.  Augmentin, prednisone,  And tussionex  Sent to [facility]

## 2018-04-12 LAB — HM MAMMOGRAPHY

## 2018-04-20 ENCOUNTER — Ambulatory Visit: Payer: BLUE CROSS/BLUE SHIELD

## 2018-06-16 DIAGNOSIS — Z85828 Personal history of other malignant neoplasm of skin: Secondary | ICD-10-CM | POA: Diagnosis not present

## 2018-06-16 DIAGNOSIS — L821 Other seborrheic keratosis: Secondary | ICD-10-CM | POA: Diagnosis not present

## 2018-06-16 DIAGNOSIS — Z08 Encounter for follow-up examination after completed treatment for malignant neoplasm: Secondary | ICD-10-CM | POA: Diagnosis not present

## 2018-06-16 DIAGNOSIS — L814 Other melanin hyperpigmentation: Secondary | ICD-10-CM | POA: Diagnosis not present

## 2018-07-08 ENCOUNTER — Other Ambulatory Visit: Payer: Self-pay | Admitting: Internal Medicine

## 2018-07-08 MED ORDER — PREDNISONE 10 MG PO TABS: ORAL_TABLET | ORAL | 0 refills | Status: DC

## 2018-07-08 MED ORDER — TRIAMCINOLONE ACETONIDE 0.1 % EX CREA: 1.0000 "application " | TOPICAL_CREAM | Freq: Two times a day (BID) | CUTANEOUS | 0 refills | Status: DC

## 2018-07-15 DIAGNOSIS — Z20828 Contact with and (suspected) exposure to other viral communicable diseases: Secondary | ICD-10-CM | POA: Diagnosis not present

## 2018-07-18 DIAGNOSIS — M9901 Segmental and somatic dysfunction of cervical region: Secondary | ICD-10-CM | POA: Diagnosis not present

## 2018-07-18 DIAGNOSIS — M5137 Other intervertebral disc degeneration, lumbosacral region: Secondary | ICD-10-CM | POA: Diagnosis not present

## 2018-07-18 DIAGNOSIS — M531 Cervicobrachial syndrome: Secondary | ICD-10-CM | POA: Diagnosis not present

## 2018-07-18 DIAGNOSIS — M9903 Segmental and somatic dysfunction of lumbar region: Secondary | ICD-10-CM | POA: Diagnosis not present

## 2018-09-02 ENCOUNTER — Other Ambulatory Visit: Payer: Self-pay

## 2018-10-29 ENCOUNTER — Other Ambulatory Visit: Payer: Self-pay | Admitting: Internal Medicine

## 2018-10-29 MED ORDER — PREDNISONE 10 MG PO TABS: ORAL_TABLET | ORAL | 0 refills | Status: DC

## 2018-10-29 MED ORDER — AMOXICILLIN-POT CLAVULANATE 875-125 MG PO TABS: 1.0000 | ORAL_TABLET | Freq: Two times a day (BID) | ORAL | 0 refills | Status: DC

## 2018-11-06 ENCOUNTER — Other Ambulatory Visit: Payer: Self-pay | Admitting: Internal Medicine

## 2018-11-06 DIAGNOSIS — Z20822 Contact with and (suspected) exposure to covid-19: Secondary | ICD-10-CM

## 2018-11-07 ENCOUNTER — Encounter: Payer: Self-pay | Admitting: Internal Medicine

## 2018-11-07 ENCOUNTER — Other Ambulatory Visit: Payer: Self-pay

## 2018-11-07 ENCOUNTER — Ambulatory Visit (INDEPENDENT_AMBULATORY_CARE_PROVIDER_SITE_OTHER): Payer: BLUE CROSS/BLUE SHIELD | Admitting: Internal Medicine

## 2018-11-07 DIAGNOSIS — Z20822 Contact with and (suspected) exposure to covid-19: Secondary | ICD-10-CM

## 2018-11-07 DIAGNOSIS — Z20828 Contact with and (suspected) exposure to other viral communicable diseases: Secondary | ICD-10-CM

## 2018-11-07 MED ORDER — PREDNISONE 10 MG PO TABS: ORAL_TABLET | ORAL | 0 refills | Status: DC

## 2018-11-07 MED ORDER — HYDROCOD POLST-CPM POLST ER 10-8 MG/5ML PO SUER: 5.0000 mL | Freq: Two times a day (BID) | ORAL | 0 refills | Status: DC | PRN

## 2018-11-07 NOTE — Assessment & Plan Note (Signed)
COVID TESTING ADVISED and ordered. She is not in respratory distress.  Advised to have husband tested as well,  And mother Joy Edwards in a few days (last contact was 2 days ago)  She has been advised to isolate for 7 days.  [facility]

## 2018-11-07 NOTE — Progress Notes (Signed)
Virtual Visit converted to Telephone Note  This visit type was conducted due to national recommendations for restrictions regarding the COVID-19 pandemic (e.g. social distancing).  This format is felt to be most appropriate for this patient at this time.  All issues noted in this document were discussed and addressed.  No physical exam was performed (except for noted visual exam findings with Video Visits).   I attempted to connect with@ on 11/07/18 at  8:00 AM EDT by a video enabled telemedicine application .  I verified that I am speaking with the correct person using two identifiers. Interactive audio and video telecommunications were attempted between this provider and patient, however failed, due to patient having technical difficulties with her internet .  We continued and completed visit with audio only. Location patient: home Location provider: work or home office Persons participating in the virtual visit: patient, provider  I discussed the limitations, risks, security and privacy concerns of performing an evaluation and management service by telephone and the availability of in person appointments. I also discussed with the patient that there may be a patient responsible charge related to this service. The patient expressed understanding and agreed to proceed.  Reason for visit: cough, headache for one wee   HPI:  63 yr old health female presents with persistent cough and headache despite empiric treatment for sinusitis  Patient's last known unmasked contact with person of unknown COVID STATUS was 10 days ago when several men entered her home to service her heating unit.  They were unmasked.  patient developed symptoms 6 days ago . Initially thought she had sinusitis.  Treated with prednisone and augmentin,  Cough persisted,  Headache persisted.  Has had contact with 21 yr old mother within the last 2 days  Patient was masked, mother was not.  And husband Franchot Mimes who was recently hospitalized  on Saturday overnightb after a fall when he sustained broken ribs.  H   ROS: See pertinent positives and negatives per HPI.  Past Medical History:  Diagnosis Date  . Cancer ([place])   . Depression   . Osteoporosis     Past Surgical History:  Procedure Laterality Date  . APPENDECTOMY      Family History  Problem Relation Age of Onset  . Alcohol abuse Father   . Alcohol abuse Sister     SOCIAL HX:  reports that she has never smoked. She has never used smokeless tobacco. She reports current alcohol use of about 7.0 standard drinks of alcohol per week. She reports that she does not use drugs.   Current Outpatient Medications:  .  buPROPion (WELLBUTRIN XL) 300 MG 24 hr tablet, Take 300 mg by mouth daily.  , Disp: , Rfl:  .  sertraline (ZOLOFT) 25 MG tablet, Take 25 mg by mouth daily. , Disp: , Rfl:  .  triamcinolone cream (KENALOG) 0.1 %, Apply 1 application topically 2 (two) times daily., Disp: 453.6 g, Rfl: 0 .  chlorpheniramine-HYDROcodone (TUSSIONEX PENNKINETIC ER) 10-8 MG/5ML SUER, Take 5 mLs by mouth every 12 (twelve) hours as needed for cough., Disp: 200 mL, Rfl: 0 .  predniSONE (DELTASONE) 10 MG tablet, 6 tablets in the morning daily for 3 days,   then reduce by 1 tablet daily until gone, Disp: 33 tablet, Rfl: 0  EXAM:  General impression: alert, cooperative and articulate.  No signs of being in distress  Lungs: speech is fluent sentence length suggests that patient is not short of breath but is  punctuated by infrequent  cough, .   Psych: affect normal.  speech is articulate and non pressured .  Denies suicidal thoughts  ASSESSMENT AND PLAN:  Discussed the following assessment and plan:  Suspected COVID-19 virus infection  Suspected COVID-19 virus infection COVID TESTING ADVISED and ordered. She is not in respratory distress.  Advised to have husband tested as well,  And mother Aquilla Hacker in a few days (last contact was 2 days ago)  She has been advised to isolate for 7  days.  [facility]     I discussed the assessment and treatment plan with the patient. The patient was provided an opportunity to ask questions and all were answered. The patient agreed with the plan and demonstrated an understanding of the instructions.   The patient was advised to call back or seek an in-person evaluation if the symptoms worsen or if the condition fails to improve as anticipated.  I provided  25 minutes of non-face-to-face time during this encounter reviewing patient's current symptoms .  Providing counseling on the above mentioned problems , and coordination  of care .   Crecencio Mc, MD

## 2018-11-07 NOTE — Patient Instructions (Signed)
   Your symptoms may be due to COVID 19 infection       The safest measure for everyone is to put yourself under quarantine.    A true "quarantine" is again, reserved for those patients with  Symptoms  It means :  YOU DO NOT LEAVE THE HOUSE. YOU SHOULD TAKE YOUR MEALS ALONE. YOU SHOULD NOT  SHARE BATHROOMS  FOR HOW LONG?:   You should stay under quarantine until you can answer "yes" to all 3 of the following conditions:  Symptoms have been present  for a minimum of 7 days ,   You  Have been afebrile for at least 24 hours    (and that means you cannot be taking tylenol, motrin or aleve)  Your symptoms must be getting better   For you, I advise another 7 days of isolation   I have called in a longer prednisone taper  And a strong cough suppressant .  If you develop wheezing,  Shortness of breath let me know immediately.

## 2018-11-08 LAB — NOVEL CORONAVIRUS, NAA: SARS-CoV-2, NAA: NOT DETECTED

## 2018-11-14 ENCOUNTER — Encounter: Payer: Self-pay | Admitting: Internal Medicine

## 2018-11-14 ENCOUNTER — Other Ambulatory Visit: Payer: Self-pay

## 2018-11-14 ENCOUNTER — Ambulatory Visit (INDEPENDENT_AMBULATORY_CARE_PROVIDER_SITE_OTHER): Payer: Self-pay | Admitting: Internal Medicine

## 2018-11-14 VITALS — Ht 65.0 in | Wt 122.0 lb

## 2018-11-14 DIAGNOSIS — T3695XA Adverse effect of unspecified systemic antibiotic, initial encounter: Secondary | ICD-10-CM

## 2018-11-14 DIAGNOSIS — Z20828 Contact with and (suspected) exposure to other viral communicable diseases: Secondary | ICD-10-CM

## 2018-11-14 DIAGNOSIS — K521 Toxic gastroenteritis and colitis: Secondary | ICD-10-CM

## 2018-11-14 DIAGNOSIS — Z20822 Contact with and (suspected) exposure to covid-19: Secondary | ICD-10-CM

## 2018-11-14 MED ORDER — HYDROCOD POLST-CPM POLST ER 10-8 MG/5ML PO SUER: 5.0000 mL | Freq: Two times a day (BID) | ORAL | 0 refills | Status: DC | PRN

## 2018-11-14 MED ORDER — AZITHROMYCIN 500 MG PO TABS: 500.0000 mg | ORAL_TABLET | Freq: Every day | ORAL | 0 refills | Status: DC

## 2018-11-14 MED ORDER — PREDNISONE 10 MG PO TABS: ORAL_TABLET | ORAL | 0 refills | Status: DC

## 2018-11-14 NOTE — Progress Notes (Addendum)
Telephone Note   This visit type was conducted due to national recommendations for restrictions regarding the COVID-19 pandemic (e.g. social distancing).  This format is felt to be most appropriate for this patient at this time.  All issues noted in this document were discussed and addressed.  No physical exam was performed (except for noted visual exam findings with Video Visits).   I connected with@ on 11/14/18 at  9:00 AM EDT by telephone and verified that I am speaking with the correct person using two identifiers. Location patient: home Location provider: work or home office Persons participating in the virtual visit: patient, provider  I discussed the limitations, risks, security and privacy concerns of performing an evaluation and management service by telephone and the availability of in person appointments. I also discussed with the patient that there may be a patient responsible charge related to this service. The patient expressed understanding and agreed to proceed.   Reason for visit: persistent cough,  Chest heaviness,   fatigue,  diarrhea   HPI:  Health 75 y old female with history of Upper respiratory infection which started on or around Sept Oct 1 ,  tested negative for COVID 19 ON oct 5 via Drive [facility] tent.  Presents with persistnet symptoms  unlrelent cough,  Chest heaviness , fatigue,  Sob with exertion and new onset diarrhea.  Thus far was initially treated d for sinusitis with augmentin for 7 days,  Prednisone and tussionex for cough. persistent subjective fever and chills    She reports having 4 to 5 loose stool daily   That are aggravated by eating.    ROS: See pertinent positives and negatives per HPI.  Past Medical History:  Diagnosis Date  . Cancer ([place])   . Depression   . Osteoporosis     Past Surgical History:  Procedure Laterality Date  . APPENDECTOMY      Family History  Problem Relation Age of Onset  . Alcohol abuse Father   . Alcohol abuse Sister      SOCIAL HX: . reports that she has never smoked. She has never used smokeless tobacco. She reports current alcohol use of about 7.0 standard drinks of alcohol per week. She reports that she does not use drugs.   Current Outpatient Medications:  .  buPROPion (WELLBUTRIN XL) 300 MG 24 hr tablet, Take 300 mg by mouth daily.  , Disp: , Rfl:  .  chlorpheniramine-HYDROcodone (TUSSIONEX PENNKINETIC ER) 10-8 MG/5ML SUER, Take 5 mLs by mouth every 12 (twelve) hours as needed for cough., Disp: 200 mL, Rfl: 0 .  sertraline (ZOLOFT) 25 MG tablet, Take 25 mg by mouth daily. , Disp: , Rfl:  .  triamcinolone cream (KENALOG) 0.1 %, Apply 1 application topically 2 (two) times daily., Disp: 453.6 g, Rfl: 0 .  azithromycin (ZITHROMAX) 500 MG tablet, Take 1 tablet (500 mg total) by mouth daily., Disp: 7 tablet, Rfl: 0 .  predniSONE (DELTASONE) 10 MG tablet, 6 tablets daily for 3 days,  then reduce by 1 tablet daily until gone, Disp: 33 tablet, Rfl: 0  EXAM:  VITALS per patient if applicable:  GENERAL: alert, oriented, appears well and in no acute distress  HEENT: atraumatic, conjunttiva clear, no obvious abnormalities on inspection of external nose and ears  NECK: normal movements of the head and neck  LUNGS: on inspection no signs of respiratory distress, breathing rate appears normal, no obvious gross SOB, gasping or wheezing  CV: no obvious cyanosis  MS: moves all visible  extremities without noticeable abnormality  PSYCH/NEURO: pleasant and cooperative, no obvious depression or anxiety, speech and thought processing grossly intact  ASSESSMENT AND PLAN:  Discussed the following assessment and plan:  Antibiotic-associated diarrhea - Plan: Clostridium Difficile by PCR([facility])  Suspected COVID-19 virus infection  Suspected COVID-19 virus infection She has symptoms that have persisted or over 2 weeks.  Prednisone  ,  Azithromycin and tussionex refilled .  c dif testing needed  Advised  to go to ER if she develops RR > 20 or sats < 92% using her father in law's pulse oximeter.     I discussed the assessment and treatment plan with the patient. The patient was provided an opportunity to ask questions and all were answered. The patient agreed with the plan and demonstrated an understanding of the instructions.   The patient was advised to call back or seek an in-person evaluation if the symptoms worsen or if the condition fails to improve as anticipated.   Crecencio Mc, MD  I provided  22 minutes of non-face-to-face time during this encounter reviewing patient's current problems , Providing counseling on the above mentioned problems , and coordination  of care .

## 2018-11-14 NOTE — Patient Instructions (Signed)
Tussionex refilled  Prednisone 60 mg daily x 3, days,  Then  begin 10 mg taper  azithromcyin 500 mg daily x 7 days  If your RR becomes elevated (> 20 /minute) or your oxygen level drops blow 92%.  Go to ER    Your diarrhea may be part of the ILLNESS ( I SUSPECT you ad a False Negative result on your COVID 19 TEST) , or c dif colitis from the first round of antibiotics  Send someone to the office to pick up a stool kit for collection of liquid stool sample

## 2018-11-14 NOTE — Assessment & Plan Note (Signed)
She has symptoms that have persisted or over 2 weeks.  Prednisone  ,  Azithromycin and tussionex refilled .  c dif testing needed  Advised to go to ER if she develops RR > 20 or sats < 92% using her father in law's pulse oximeter.

## 2018-11-16 ENCOUNTER — Ambulatory Visit: Payer: Self-pay | Admitting: Internal Medicine

## 2018-12-05 DIAGNOSIS — M5441 Lumbago with sciatica, right side: Secondary | ICD-10-CM | POA: Diagnosis not present

## 2018-12-05 DIAGNOSIS — M9904 Segmental and somatic dysfunction of sacral region: Secondary | ICD-10-CM | POA: Diagnosis not present

## 2018-12-05 DIAGNOSIS — M9903 Segmental and somatic dysfunction of lumbar region: Secondary | ICD-10-CM | POA: Diagnosis not present

## 2018-12-05 DIAGNOSIS — M5137 Other intervertebral disc degeneration, lumbosacral region: Secondary | ICD-10-CM | POA: Diagnosis not present

## 2018-12-06 ENCOUNTER — Other Ambulatory Visit: Payer: Self-pay

## 2018-12-06 ENCOUNTER — Ambulatory Visit (INDEPENDENT_AMBULATORY_CARE_PROVIDER_SITE_OTHER): Payer: Self-pay

## 2018-12-06 DIAGNOSIS — Z23 Encounter for immunization: Secondary | ICD-10-CM

## 2018-12-09 DIAGNOSIS — M9903 Segmental and somatic dysfunction of lumbar region: Secondary | ICD-10-CM | POA: Diagnosis not present

## 2018-12-09 DIAGNOSIS — M5137 Other intervertebral disc degeneration, lumbosacral region: Secondary | ICD-10-CM | POA: Diagnosis not present

## 2018-12-09 DIAGNOSIS — M9904 Segmental and somatic dysfunction of sacral region: Secondary | ICD-10-CM | POA: Diagnosis not present

## 2018-12-09 DIAGNOSIS — M5441 Lumbago with sciatica, right side: Secondary | ICD-10-CM | POA: Diagnosis not present

## 2018-12-14 DIAGNOSIS — M5441 Lumbago with sciatica, right side: Secondary | ICD-10-CM | POA: Diagnosis not present

## 2018-12-14 DIAGNOSIS — M9904 Segmental and somatic dysfunction of sacral region: Secondary | ICD-10-CM | POA: Diagnosis not present

## 2018-12-14 DIAGNOSIS — M5137 Other intervertebral disc degeneration, lumbosacral region: Secondary | ICD-10-CM | POA: Diagnosis not present

## 2018-12-14 DIAGNOSIS — M9903 Segmental and somatic dysfunction of lumbar region: Secondary | ICD-10-CM | POA: Diagnosis not present

## 2019-01-31 DIAGNOSIS — Z85828 Personal history of other malignant neoplasm of skin: Secondary | ICD-10-CM | POA: Diagnosis not present

## 2019-01-31 DIAGNOSIS — Z08 Encounter for follow-up examination after completed treatment for malignant neoplasm: Secondary | ICD-10-CM | POA: Diagnosis not present

## 2019-01-31 DIAGNOSIS — X32XXXA Exposure to sunlight, initial encounter: Secondary | ICD-10-CM | POA: Diagnosis not present

## 2019-01-31 DIAGNOSIS — C44529 Squamous cell carcinoma of skin of other part of trunk: Secondary | ICD-10-CM | POA: Diagnosis not present

## 2019-01-31 DIAGNOSIS — R208 Other disturbances of skin sensation: Secondary | ICD-10-CM | POA: Diagnosis not present

## 2019-01-31 DIAGNOSIS — L570 Actinic keratosis: Secondary | ICD-10-CM | POA: Diagnosis not present

## 2019-01-31 DIAGNOSIS — D485 Neoplasm of uncertain behavior of skin: Secondary | ICD-10-CM | POA: Diagnosis not present

## 2019-02-21 ENCOUNTER — Ambulatory Visit: Payer: PPO | Attending: Internal Medicine

## 2019-02-21 DIAGNOSIS — Z23 Encounter for immunization: Secondary | ICD-10-CM | POA: Insufficient documentation

## 2019-02-21 NOTE — Progress Notes (Signed)
   Covid-19 Vaccination Clinic  Name:  Joy Edwards    MRN: [id number] DOB: August 05, 1955  02/21/2019  [name] was observed post Covid-19 immunization for 15 minutes without incidence. She was provided with Vaccine Information Sheet and instruction to access the V-Safe system.   [name] was instructed to call 911 with any severe reactions post vaccine: Marland Kitchen Difficulty breathing  . Swelling of your face and throat  . A fast heartbeat  . A bad rash all over your body  . Dizziness and weakness    Immunizations Administered    Name Date Dose VIS Date Route   Pfizer COVID-19 Vaccine 02/21/2019  9:04 AM 0.3 mL 01/13/2019 Intramuscular   Manufacturer: [employer], [employer]   Lot: [id number]   [place]: [id number]

## 2019-02-22 DIAGNOSIS — C44529 Squamous cell carcinoma of skin of other part of trunk: Secondary | ICD-10-CM | POA: Diagnosis not present

## 2019-03-11 ENCOUNTER — Ambulatory Visit: Payer: PPO | Attending: Internal Medicine

## 2019-03-11 DIAGNOSIS — Z23 Encounter for immunization: Secondary | ICD-10-CM

## 2019-03-11 NOTE — Progress Notes (Signed)
   Covid-19 Vaccination Clinic  Name:  Joy Edwards    MRN: [id number] DOB: 1955-03-27  03/11/2019  [name] was observed post Covid-19 immunization for 15 minutes without incidence. She was provided with Vaccine Information Sheet and instruction to access the V-Safe system.   [name] was instructed to call 911 with any severe reactions post vaccine: Marland Kitchen Difficulty breathing  . Swelling of your face and throat  . A fast heartbeat  . A bad rash all over your body  . Dizziness and weakness    Immunizations Administered    Name Date Dose VIS Date Route   Pfizer COVID-19 Vaccine 03/11/2019  1:13 PM 0.3 mL 01/13/2019 Intramuscular   Manufacturer: [place]   Lot: [id number]   [place]: [id number]

## 2019-06-09 DIAGNOSIS — L821 Other seborrheic keratosis: Secondary | ICD-10-CM | POA: Diagnosis not present

## 2019-06-09 DIAGNOSIS — L570 Actinic keratosis: Secondary | ICD-10-CM | POA: Diagnosis not present

## 2019-06-09 DIAGNOSIS — L603 Nail dystrophy: Secondary | ICD-10-CM | POA: Diagnosis not present

## 2019-06-09 DIAGNOSIS — D485 Neoplasm of uncertain behavior of skin: Secondary | ICD-10-CM | POA: Diagnosis not present

## 2019-06-09 DIAGNOSIS — Z08 Encounter for follow-up examination after completed treatment for malignant neoplasm: Secondary | ICD-10-CM | POA: Diagnosis not present

## 2019-06-09 DIAGNOSIS — L308 Other specified dermatitis: Secondary | ICD-10-CM | POA: Diagnosis not present

## 2019-06-09 DIAGNOSIS — X32XXXA Exposure to sunlight, initial encounter: Secondary | ICD-10-CM | POA: Diagnosis not present

## 2019-06-09 DIAGNOSIS — Z85828 Personal history of other malignant neoplasm of skin: Secondary | ICD-10-CM | POA: Diagnosis not present

## 2019-06-09 DIAGNOSIS — C44622 Squamous cell carcinoma of skin of right upper limb, including shoulder: Secondary | ICD-10-CM | POA: Diagnosis not present

## 2019-06-19 DIAGNOSIS — M9903 Segmental and somatic dysfunction of lumbar region: Secondary | ICD-10-CM | POA: Diagnosis not present

## 2019-06-19 DIAGNOSIS — M9904 Segmental and somatic dysfunction of sacral region: Secondary | ICD-10-CM | POA: Diagnosis not present

## 2019-06-19 DIAGNOSIS — M9902 Segmental and somatic dysfunction of thoracic region: Secondary | ICD-10-CM | POA: Diagnosis not present

## 2019-06-19 DIAGNOSIS — M542 Cervicalgia: Secondary | ICD-10-CM | POA: Diagnosis not present

## 2019-06-19 DIAGNOSIS — M9901 Segmental and somatic dysfunction of cervical region: Secondary | ICD-10-CM | POA: Diagnosis not present

## 2019-06-19 DIAGNOSIS — M461 Sacroiliitis, not elsewhere classified: Secondary | ICD-10-CM | POA: Diagnosis not present

## 2019-06-21 ENCOUNTER — Ambulatory Visit (INDEPENDENT_AMBULATORY_CARE_PROVIDER_SITE_OTHER): Payer: PPO

## 2019-06-21 VITALS — Ht 65.0 in | Wt 122.0 lb

## 2019-06-21 DIAGNOSIS — Z0000 Encounter for general adult medical examination without abnormal findings: Secondary | ICD-10-CM | POA: Diagnosis not present

## 2019-06-21 NOTE — Progress Notes (Addendum)
Subjective:   Joy Edwards is a 64 y.o. female who presents for an Initial Medicare Annual Wellness Visit.  Review of Systems    No ROS.  Medicare Wellness Virtual Visit.  Visual/audio telehealth visit, UTA vital signs.   Ht/Wt provided.  See social history for additional risk factors.  Cardiac Risk Factors include: advanced age (>21men, >77 women)     Objective:    Today's Vitals   06/21/19 1054  Weight: 122 lb (55.3 kg)  Height: 5\' 5"  (1.651 m)   Body mass index is 20.3 kg/m.  Advanced Directives 06/21/2019  Does Patient Have a Medical Advance Directive? Yes  Type of Paramedic of [place];Living will  Does patient want to make changes to medical advance directive? No - Patient declined  Copy of [place] in Chart? No - copy requested    Current Medications (verified) Outpatient Encounter Medications as of 06/21/2019  Medication Sig  . buPROPion (WELLBUTRIN XL) 300 MG 24 hr tablet Take 300 mg by mouth daily.    . sertraline (ZOLOFT) 25 MG tablet Take 25 mg by mouth daily.   Marland Kitchen triamcinolone cream (KENALOG) 0.1 % Apply 1 application topically 2 (two) times daily.  . [DISCONTINUED] azithromycin (ZITHROMAX) 500 MG tablet Take 1 tablet (500 mg total) by mouth daily.  . [DISCONTINUED] chlorpheniramine-HYDROcodone (TUSSIONEX PENNKINETIC ER) 10-8 MG/5ML SUER Take 5 mLs by mouth every 12 (twelve) hours as needed for cough.  . [DISCONTINUED] predniSONE (DELTASONE) 10 MG tablet 6 tablets daily for 3 days,  then reduce by 1 tablet daily until gone   No facility-administered encounter medications on file as of 06/21/2019.    Allergies (verified) Patient has no known allergies.   History: Past Medical History:  Diagnosis Date  . Cancer ([place])   . Depression   . Osteoporosis    Past Surgical History:  Procedure Laterality Date  . APPENDECTOMY     Family History  Problem Relation Age of Onset  . Alcohol abuse Father   .  Alcohol abuse Sister    Social History   Socioeconomic History  . Marital status: Married    Spouse name: Not on file  . Number of children: Not on file  . Years of education: Not on file  . Highest education level: Not on file  Occupational History  . Not on file  Tobacco Use  . Smoking status: Never Smoker  . Smokeless tobacco: Never Used  Substance and Sexual Activity  . Alcohol use: Yes    Alcohol/week: 7.0 standard drinks    Types: 7 Glasses of wine per week    Comment: wine  . Drug use: No  . Sexual activity: Not on file  Other Topics Concern  . Not on file  Social History Narrative  . Not on file   Social Determinants of Health   Financial Resource Strain:   . Difficulty of Paying Living Expenses:   Food Insecurity:   . Worried About Charity fundraiser in the Last Year:   . Arboriculturist in the Last Year:   Transportation Needs:   . Film/video editor (Medical):   Marland Kitchen Lack of Transportation (Non-Medical):   Physical Activity:   . Days of Exercise per Week:   . Minutes of Exercise per Session:   Stress:   . Feeling of Stress :   Social Connections:   . Frequency of Communication with Friends and Family:   . Frequency of Social Gatherings  with Friends and Family:   . Attends Religious Services:   . Active Member of Clubs or Organizations:   . Attends Archivist Meetings:   Marland Kitchen Marital Status:     Tobacco Counseling Counseling given: Not Answered   Clinical Intake:  Pre-visit preparation completed: Yes        Diabetes: No  How often do you need to have someone help you when you read instructions, pamphlets, or other written materials from your doctor or pharmacy?: 1 - Never  Interpreter Needed?: No      Activities of Daily Living In your present state of health, do you have any difficulty performing the following activities: 06/21/2019  Hearing? Y  Comment Request for audiology referral. Difficulty hearing conversational  tones.  Vision? N  Difficulty concentrating or making decisions? N  Walking or climbing stairs? N  Dressing or bathing? N  Doing errands, shopping? N  Preparing Food and eating ? N  Using the Toilet? N  In the past six months, have you accidently leaked urine? Y  Comment Managed with liner as needed.  Do you have problems with loss of bowel control? N  Managing your Medications? N  Managing your Finances? N  Housekeeping or managing your Housekeeping? N  Some recent data might be hidden     Immunizations and Health Maintenance Immunization History  Administered Date(s) Administered  . Fluad Quad(high Dose 65+) 12/06/2018  . Influenza Split 10/27/2010  . Influenza,inj,Quad PF,6+ Mos 03/27/2014  . Influenza-Unspecified 12/09/2012  . PFIZER SARS-COV-2 Vaccination 02/21/2019, 03/11/2019  . Tdap 03/27/2014  . Zoster 04/19/2017  . Zoster Recombinat (Shingrix) 07/14/2017   Health Maintenance Due  Topic Date Due  . HIV Screening  Never done  . PNA vac Low Risk Adult (1 of 2 - PCV13) Never done    Patient Care Team: Crecencio Mc, MD as PCP - General (Internal Medicine)  Indicate any recent Medical Services you may have received from other than [facility] providers in the past year (date may be approximate).     Assessment:   This is a routine wellness examination for Joy Edwards.  Nurse connected with patient 06/21/19 at 10:30 AM EDT by a telephone enabled telemedicine application, patient has no access to video or difficulty with video and verified that I am speaking with the correct person using two identifiers. Patient stated full name and DOB. Patient gave permission to continue with virtual visit. Patient's location was at home and Nurse's location was at [place] office.   Patient is alert and oriented x3. Patient denies difficulty focusing or concentrating. Patient likes to read, play computer games, complete puzzles for brain stimulation.   Health Maintenance Due: -PNA  vaccine- discussed; to be completed with doctor in visit or local pharmacy.  See completed HM at the end of note.   Dental: Visits every 6 months.    Safety:  Patient feels safe at home- yes Patient does have smoke detectors at home- yes Patient does wear sunscreen or protective clothing when in direct sunlight - yes Patient does wear seat belt when in a moving vehicle - yes Patient drives- yes Adequate lighting in walkways free from debris- yes Grab bars and handrails used as appropriate- yes Ambulates with an assistive device- no  Cell phone on person when ambulating outside of the home-yes  Social: Alcohol intake - yes      Smoking history- never   Smokers in home? none Illicit drug use? none  Medication: Taking as directed and  without issues.  Self managed - yes   Covid-19: Precautions and sickness symptoms discussed. Wears mask, social distancing, hand hygiene as appropriate.   Activities of Daily Living Patient denies needing assistance with: household chores, feeding themselves, getting from bed to chair, getting to the toilet, bathing/showering, dressing, managing money, or preparing meals.   Discussed the importance of a healthy diet, water intake and the benefits of aerobic exercise.   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 Walks 30,000 steps when able.    Diet:  Healthy Water: good intake Caffeine: 2 cups of coffee  Other Providers Patient Care Team: Crecencio Mc, MD as PCP - General (Internal Medicine) Hearing/Vision screen  Hearing Screening   125Hz  250Hz  500Hz  1000Hz  2000Hz  3000Hz  4000Hz  6000Hz  8000Hz   Right ear:           Left ear:           Comments: Patient notes change in hearing conversational tones, bilateral. Family history hearing loss. Agrees to referral placement after next scheduled visit with PMD.    Vision Screening Comments: Wears corrective lenses Visual acuity not assessed, virtual visit.  They have seen  their ophthalmologist in the last 12 months.     Dietary issues and exercise activities discussed:    Goals      Patient Stated   . I'd like to walk more when my leg is feeling better (pt-stated)      Depression Screen PHQ 2/9 Scores 06/21/2019  PHQ - 2 Score 0    Fall Risk Fall Risk  06/21/2019 11/14/2018 11/07/2018 09/02/2018  Falls in the past year? 0 0 0 (No Data)  Comment - - - Emmi Telephone Survey: data to providers prior to load  Number falls in past yr: - - - (No Data)  Comment - - - Emmi Telephone Survey Actual Response =   Follow up Falls evaluation completed Falls evaluation completed - -   Timed Get Up and Go Performed no, virtual visit  Cognitive Function: MMSE - Mini Mental State Exam 06/21/2019  Not completed: Unable to complete        Screening Tests Health Maintenance  Topic Date Due  . HIV Screening  Never done  . PNA vac Low Risk Adult (1 of 2 - PCV13) Never done  . INFLUENZA VACCINE  09/03/2019  . MAMMOGRAM  04/11/2020  . PAP SMEAR-Modifier  05/20/2020  . TETANUS/TDAP  03/27/2024  . COLONOSCOPY  07/29/2027  . DEXA SCAN  Completed  . COVID-19 Vaccine  Completed  . Hepatitis C Screening  Completed     Plan:   Keep all routine maintenance appointments.   6 month follow up  07/24/19 @ 8:00. Hearing changes. Patient will be fasting. Last lab panel completed 05/12/17.   Patient agrees to verify pneumonia and shingle vaccine with pharmacist, update PMD office upon completion.   Hearing: Patient notes change in hearing conversational tones, bilateral. Family history hearing loss. Agrees to referral placement after next scheduled visit with PMD.      Medicare Attestation I have personally reviewed: The patient's medical and social history Their use of alcohol, tobacco or illicit drugs Their current medications and supplements The patient's functional ability including ADLs,fall risks, home safety risks, cognitive, and hearing and visual  impairment Diet and physical activities Evidence for depression   I have reviewed and discussed with patient certain preventive protocols, quality metrics, and best practice recommendations.     Varney Biles, LPN   [id number]  I have reviewed the above information and agree with above.   Deborra Medina, MD

## 2019-06-21 NOTE — Patient Instructions (Addendum)
  Joy Edwards , Thank you for taking time to come for your Medicare Wellness Visit. I appreciate your ongoing commitment to your health goals. Please review the following plan we discussed and let me know if I can assist you in the future.   These are the goals we discussed: Goals      Patient Stated   . I'd like to walk more when my leg is feeling better (pt-stated)       This is a list of the screening recommended for you and due dates:  Health Maintenance  Topic Date Due  . HIV Screening  Never done  . Pneumonia vaccines (1 of 2 - PCV13) Never done  . Flu Shot  09/03/2019  . Mammogram  04/11/2020  . Pap Smear  05/20/2020  . Tetanus Vaccine  03/27/2024  . Colon Cancer Screening  07/29/2027  . DEXA scan (bone density measurement)  Completed  . COVID-19 Vaccine  Completed  .  Hepatitis C: One time screening is recommended by Center for Disease Control  (CDC) for  adults born from [birth year] through 1967.   Completed

## 2019-06-22 DIAGNOSIS — M9902 Segmental and somatic dysfunction of thoracic region: Secondary | ICD-10-CM | POA: Diagnosis not present

## 2019-06-22 DIAGNOSIS — M461 Sacroiliitis, not elsewhere classified: Secondary | ICD-10-CM | POA: Diagnosis not present

## 2019-06-22 DIAGNOSIS — M9901 Segmental and somatic dysfunction of cervical region: Secondary | ICD-10-CM | POA: Diagnosis not present

## 2019-06-22 DIAGNOSIS — C44622 Squamous cell carcinoma of skin of right upper limb, including shoulder: Secondary | ICD-10-CM | POA: Diagnosis not present

## 2019-06-22 DIAGNOSIS — M9903 Segmental and somatic dysfunction of lumbar region: Secondary | ICD-10-CM | POA: Diagnosis not present

## 2019-06-22 DIAGNOSIS — M9904 Segmental and somatic dysfunction of sacral region: Secondary | ICD-10-CM | POA: Diagnosis not present

## 2019-06-22 DIAGNOSIS — M542 Cervicalgia: Secondary | ICD-10-CM | POA: Diagnosis not present

## 2019-07-04 DIAGNOSIS — M9904 Segmental and somatic dysfunction of sacral region: Secondary | ICD-10-CM | POA: Diagnosis not present

## 2019-07-04 DIAGNOSIS — M542 Cervicalgia: Secondary | ICD-10-CM | POA: Diagnosis not present

## 2019-07-04 DIAGNOSIS — M9902 Segmental and somatic dysfunction of thoracic region: Secondary | ICD-10-CM | POA: Diagnosis not present

## 2019-07-04 DIAGNOSIS — M461 Sacroiliitis, not elsewhere classified: Secondary | ICD-10-CM | POA: Diagnosis not present

## 2019-07-04 DIAGNOSIS — M9903 Segmental and somatic dysfunction of lumbar region: Secondary | ICD-10-CM | POA: Diagnosis not present

## 2019-07-04 DIAGNOSIS — M9901 Segmental and somatic dysfunction of cervical region: Secondary | ICD-10-CM | POA: Diagnosis not present

## 2019-07-18 DIAGNOSIS — M542 Cervicalgia: Secondary | ICD-10-CM | POA: Diagnosis not present

## 2019-07-18 DIAGNOSIS — M461 Sacroiliitis, not elsewhere classified: Secondary | ICD-10-CM | POA: Diagnosis not present

## 2019-07-18 DIAGNOSIS — M9901 Segmental and somatic dysfunction of cervical region: Secondary | ICD-10-CM | POA: Diagnosis not present

## 2019-07-18 DIAGNOSIS — M9903 Segmental and somatic dysfunction of lumbar region: Secondary | ICD-10-CM | POA: Diagnosis not present

## 2019-07-18 DIAGNOSIS — M9904 Segmental and somatic dysfunction of sacral region: Secondary | ICD-10-CM | POA: Diagnosis not present

## 2019-07-18 DIAGNOSIS — M9902 Segmental and somatic dysfunction of thoracic region: Secondary | ICD-10-CM | POA: Diagnosis not present

## 2019-07-24 ENCOUNTER — Ambulatory Visit (INDEPENDENT_AMBULATORY_CARE_PROVIDER_SITE_OTHER): Payer: PPO | Admitting: Internal Medicine

## 2019-07-24 ENCOUNTER — Other Ambulatory Visit: Payer: Self-pay

## 2019-07-24 ENCOUNTER — Ambulatory Visit (INDEPENDENT_AMBULATORY_CARE_PROVIDER_SITE_OTHER): Payer: PPO

## 2019-07-24 ENCOUNTER — Encounter: Payer: Self-pay | Admitting: Internal Medicine

## 2019-07-24 VITALS — BP 112/70 | HR 72 | Temp 98.2°F | Resp 14 | Ht 65.0 in | Wt 118.6 lb

## 2019-07-24 DIAGNOSIS — E1169 Type 2 diabetes mellitus with other specified complication: Secondary | ICD-10-CM

## 2019-07-24 DIAGNOSIS — M25551 Pain in right hip: Secondary | ICD-10-CM | POA: Diagnosis not present

## 2019-07-24 DIAGNOSIS — E559 Vitamin D deficiency, unspecified: Secondary | ICD-10-CM

## 2019-07-24 DIAGNOSIS — G8929 Other chronic pain: Secondary | ICD-10-CM

## 2019-07-24 DIAGNOSIS — E785 Hyperlipidemia, unspecified: Secondary | ICD-10-CM | POA: Diagnosis not present

## 2019-07-24 DIAGNOSIS — M7918 Myalgia, other site: Secondary | ICD-10-CM

## 2019-07-24 DIAGNOSIS — R102 Pelvic and perineal pain: Secondary | ICD-10-CM | POA: Diagnosis not present

## 2019-07-24 DIAGNOSIS — H903 Sensorineural hearing loss, bilateral: Secondary | ICD-10-CM | POA: Diagnosis not present

## 2019-07-24 LAB — COMPREHENSIVE METABOLIC PANEL
ALT: 17 U/L (ref 0–35)
AST: 18 U/L (ref 0–37)
Albumin: 4.3 g/dL (ref 3.5–5.2)
Alkaline Phosphatase: 59 U/L (ref 39–117)
BUN: 14 mg/dL (ref 6–23)
CO2: 29 mEq/L (ref 19–32)
Calcium: 9.3 mg/dL (ref 8.4–10.5)
Chloride: 104 mEq/L (ref 96–112)
Creatinine, Ser: 0.78 mg/dL (ref 0.40–1.20)
GFR: 73.88 mL/min (ref >60.00)
Glucose, Bld: 97 mg/dL (ref 70–99)
Potassium: 4 mEq/L (ref 3.5–5.1)
Sodium: 139 mEq/L (ref 135–145)
Total Bilirubin: 0.4 mg/dL (ref 0.2–1.2)
Total Protein: 6.6 g/dL (ref 6.0–8.3)

## 2019-07-24 LAB — LIPID PANEL
Cholesterol: 184 mg/dL (ref 0–200)
HDL: 77.2 mg/dL (ref >39.00)
LDL Cholesterol: 95 mg/dL (ref 0–99)
NonHDL: 106.51
Total CHOL/HDL Ratio: 2
Triglycerides: 59 mg/dL (ref 0.0–149.0)
VLDL: 11.8 mg/dL (ref 0.0–40.0)

## 2019-07-24 LAB — SEDIMENTATION RATE: Sed Rate: 8 mm/hr (ref 0–30)

## 2019-07-24 LAB — VITAMIN D 25 HYDROXY (VIT D DEFICIENCY, FRACTURES): VITD: 59.22 ng/mL (ref 30.00–100.00)

## 2019-07-24 LAB — TSH: TSH: 3.83 u[IU]/mL (ref 0.35–4.50)

## 2019-07-24 MED ORDER — TRAMADOL HCL 50 MG PO TABS: 50.0000 mg | ORAL_TABLET | Freq: Three times a day (TID) | ORAL | 0 refills | Status: AC | PRN

## 2019-07-24 MED ORDER — PREDNISONE 10 MG PO TABS: ORAL_TABLET | ORAL | 0 refills | Status: DC

## 2019-07-24 NOTE — Assessment & Plan Note (Addendum)
History suggests pyriformis or biceps femoris avulsion of muscle off of ischial tuberosity.  X rays done today to rule out more sinister issues. Referral to sports medicine.

## 2019-07-24 NOTE — Patient Instructions (Signed)
I think you have bursitis from a partial tear of your biceps femoris   Prednisone taper,  Tylenol 1000 mg twice daily,  Tramadol for severe pain  Referral to Charlann Boxer (sports medicine, [facility] in  [place])  Office will call you with appt

## 2019-07-24 NOTE — Progress Notes (Signed)
Subjective:  Patient ID: Joy Edwards, female    DOB: 07/19/1955  Age: 63 y.o. MRN: [id number]  CC: The primary encounter diagnosis was Hyperlipidemia associated with type 2 diabetes mellitus ([place]). Diagnoses of Vitamin D deficiency, Chronic pain of right hip, and Right buttock pain were also pertinent to this visit.  HPI Joy Edwards presents for evaluation of several new complaints  1) hearing loss: has been gradually progressive for the past 3 years , preceded by tinnitus for 6 years.   Tinnitus improves with plugging both ears but not with plugging one. Medium high pitch  2) persistent pain in the Right buttock over the ischial tuberosity. Occurred after a slip and near fall in mid May  Opposite  foot slid out and hyperextended the hip.  Tried using ibuprofren with no significant change, tried Arnicare,  And chiropractic massage. Buttock Hurts constantly ,  Improves but does not resolve with standing.     Exam:  Right buttock pain improved with flexion of hip but aggravated by straight leg lift and Internal/external rotation    Outpatient Medications Prior to Visit  Medication Sig Dispense Refill  . buPROPion (WELLBUTRIN XL) 300 MG 24 hr tablet Take 300 mg by mouth daily.      . clobetasol ointment (TEMOVATE) 0.05 %     . sertraline (ZOLOFT) 50 MG tablet Take 50 mg by mouth daily.     . sertraline (ZOLOFT) 25 MG tablet Take 25 mg by mouth daily.  (Patient not taking: Reported on 07/24/2019)    . triamcinolone cream (KENALOG) 0.1 % Apply 1 application topically 2 (two) times daily. (Patient not taking: Reported on 07/24/2019) 453.6 g 0   No facility-administered medications prior to visit.    Review of Systems;  Patient denies headache, fevers, malaise, unintentional weight loss, skin rash, eye pain, sinus congestion and sinus pain, sore throat, dysphagia,  hemoptysis , cough, dyspnea, wheezing, chest pain, palpitations, orthopnea, edema, abdominal pain, nausea, melena,  diarrhea, constipation, flank pain, dysuria, hematuria, urinary  Frequency, nocturia, numbness, tingling, seizures,  Focal weakness, Loss of consciousness,  Tremor, insomnia, depression, anxiety, and suicidal ideation.      Objective:  BP 112/70 (BP Location: Left Arm, Patient Position: Sitting, Cuff Size: Normal)   Pulse 72   Temp 98.2 F (36.8 C) (Temporal)   Resp 14   Ht 5\' 5"  (1.651 m)   Wt 118 lb 9.6 oz (53.8 kg)   SpO2 96%   BMI 19.74 kg/m   BP Readings from Last 3 Encounters:  07/24/19 112/70  12/21/17 104/62  05/20/17 98/72    Wt Readings from Last 3 Encounters:  07/24/19 118 lb 9.6 oz (53.8 kg)  06/21/19 122 lb (55.3 kg)  11/14/18 122 lb (55.3 kg)    General appearance: alert, cooperative and appears stated age Ears: normal TM's and external ear canals both ears Throat: lips, mucosa, and tongue normal; teeth and gums normal Neck: no adenopathy, no carotid bruit, supple, symmetrical, trachea midline and thyroid not enlarged, symmetric, no tenderness/mass/nodules Back: symmetric, no curvature. ROM normal. No CVA tenderness. Lungs: clear to auscultation bilaterally Heart: regular rate and rhythm, S1, S2 normal, no murmur, click, rub or gallop Abdomen: soft, non-tender; bowel sounds normal; no masses,  no organomegaly Pulses: 2+ and symmetric Ext: Full ROM of right hip , buttock pain improves with full flexion of hip with knee flexed,  Worse with straight leg lift , internal and external rotation Skin: Skin color, texture, turgor normal. No rashes  or lesions Lymph nodes: Cervical, supraclavicular, and axillary nodes normal.  No results found for: HGBA1C  Lab Results  Component Value Date   CREATININE 0.78 07/24/2019   CREATININE 0.75 05/12/2017   CREATININE 0.86 03/27/2014    Lab Results  Component Value Date   WBC 4.9 03/27/2014   HGB 13.0 03/27/2014   HCT 37.9 03/27/2014   PLT 210.0 03/27/2014   GLUCOSE 97 07/24/2019   CHOL 184 07/24/2019   TRIG 59.0  07/24/2019   HDL 77.20 07/24/2019   LDLCALC 95 07/24/2019   ALT 17 07/24/2019   AST 18 07/24/2019   NA 139 07/24/2019   K 4.0 07/24/2019   CL 104 07/24/2019   CREATININE 0.78 07/24/2019   BUN 14 07/24/2019   CO2 29 07/24/2019   TSH 3.83 07/24/2019    No results found.  Assessment & Plan:   Problem List Items Addressed This Visit      Unprioritized   Hyperlipidemia associated with type 2 diabetes mellitus ([place]) - Primary   Relevant Orders   Lipid panel (Completed)   Comprehensive metabolic panel (Completed)   TSH (Completed)   Right buttock pain    History suggests pyriformis or biceps femoris avulsion of muscle off of ischial tuberosity.  X rays done today to rule out more sinister issues. Referral to sports medicine.       Relevant Orders   DG Hip Unilat W OR W/O Pelvis 2-3 Views Right (Completed)   Ambulatory referral to Sports Medicine   Vitamin D deficiency   Relevant Orders   VITAMIN D 25 Hydroxy (Vit-D Deficiency, Fractures) (Completed)    Other Visit Diagnoses    Chronic pain of right hip       Relevant Medications   sertraline (ZOLOFT) 50 MG tablet   predniSONE (DELTASONE) 10 MG tablet   traMADol (ULTRAM) 50 MG tablet   Other Relevant Orders   Sedimentation rate (Completed)      I have discontinued Mardene Celeste B. Apo's triamcinolone cream. I am also having her start on predniSONE and traMADol. Additionally, I am having her maintain her buPROPion, clobetasol ointment, and sertraline.  Meds ordered this encounter  Medications  . predniSONE (DELTASONE) 10 MG tablet    Sig: 6 tablets on Day 1 , then reduce by 1 tablet daily until gone    Dispense:  21 tablet    Refill:  0  . traMADol (ULTRAM) 50 MG tablet    Sig: Take 1 tablet (50 mg total) by mouth every 8 (eight) hours as needed for up to 5 days.    Dispense:  15 tablet    Refill:  0    Medications Discontinued During This Encounter  Medication Reason  . triamcinolone cream (KENALOG) 0.1 % Change  in therapy  . sertraline (ZOLOFT) 25 MG tablet Change in therapy    Follow-up: No follow-ups on file.   Crecencio Mc, MD

## 2019-07-24 NOTE — Assessment & Plan Note (Signed)
suspected  Based on history of progressive loss preceded by years of tinnitus

## 2019-07-26 ENCOUNTER — Telehealth: Payer: Self-pay | Admitting: Internal Medicine

## 2019-07-26 DIAGNOSIS — H903 Sensorineural hearing loss, bilateral: Secondary | ICD-10-CM

## 2019-07-26 NOTE — Telephone Encounter (Signed)
Pt called asked for a referral to be placed for her to see an ENT/Audiologist she said they discussed it at her visit. She said it doesn't matter where just first available if possible.

## 2019-07-26 NOTE — Telephone Encounter (Signed)
Referral in progress. 

## 2019-07-26 NOTE — Telephone Encounter (Signed)
Pt is aware that referral has been placed. Pt was advised that if she does not hear anything from our office of the ENT office in a week to please give [place] a call so we can check on the referral. Pt gave a verbal understanding.

## 2019-07-27 DIAGNOSIS — M542 Cervicalgia: Secondary | ICD-10-CM | POA: Diagnosis not present

## 2019-07-27 DIAGNOSIS — M9901 Segmental and somatic dysfunction of cervical region: Secondary | ICD-10-CM | POA: Diagnosis not present

## 2019-07-27 DIAGNOSIS — M461 Sacroiliitis, not elsewhere classified: Secondary | ICD-10-CM | POA: Diagnosis not present

## 2019-07-27 DIAGNOSIS — M9902 Segmental and somatic dysfunction of thoracic region: Secondary | ICD-10-CM | POA: Diagnosis not present

## 2019-07-27 DIAGNOSIS — M9903 Segmental and somatic dysfunction of lumbar region: Secondary | ICD-10-CM | POA: Diagnosis not present

## 2019-07-27 DIAGNOSIS — M9904 Segmental and somatic dysfunction of sacral region: Secondary | ICD-10-CM | POA: Diagnosis not present

## 2019-08-08 ENCOUNTER — Encounter: Payer: Self-pay | Admitting: Family Medicine

## 2019-08-08 ENCOUNTER — Ambulatory Visit: Payer: Self-pay

## 2019-08-08 ENCOUNTER — Other Ambulatory Visit: Payer: Self-pay

## 2019-08-08 ENCOUNTER — Ambulatory Visit: Payer: PPO | Admitting: Family Medicine

## 2019-08-08 VITALS — BP 110/80 | HR 64 | Ht 65.0 in | Wt 118.0 lb

## 2019-08-08 DIAGNOSIS — S76391A Other specified injury of muscle, fascia and tendon of the posterior muscle group at thigh level, right thigh, initial encounter: Secondary | ICD-10-CM | POA: Diagnosis not present

## 2019-08-08 DIAGNOSIS — M79604 Pain in right leg: Secondary | ICD-10-CM

## 2019-08-08 MED ORDER — VITAMIN D (ERGOCALCIFEROL) 1.25 MG (50000 UNIT) PO CAPS
50000.0000 [IU] | ORAL_CAPSULE | ORAL | 0 refills | Status: DC
Start: 2019-08-08 — End: 2020-07-25

## 2019-08-08 NOTE — Progress Notes (Signed)
Joy Edwards [address] Joy Edwards Phone: [phone] Subjective:   I Joy Edwards am serving as a Education administrator for Dr. Hulan Edwards.  This visit occurred during the SARS-CoV-2 public health emergency.  Safety protocols were in place, including screening questions prior to the visit, additional usage of staff PPE, and extensive cleaning of exam room while observing appropriate contact time as indicated for disinfecting solutions.   I'm seeing this patient by the request  of:  Joy Mc, MD  CC: Right leg pain  [id number]  Joy Edwards is a 64 y.o. female coming in with complaint of right hamstring pain. Patient states while wearing flip flops on vacation when she slipped.   Onset- 2 months  Location - proximal hamstring  Duration- consistent pain  Character- sharp  Aggravating factors- sitting, stairs  Reliving factors-  Therapies tried- ice, heat, topical, oral (prednisone that didn't help), stretching  Severity- 8/10 at its worse    X-rays taken on June 21 of patient's left hip independently visualized by me with no significant bony abnormality  Past Medical History:  Diagnosis Date  . Cancer ([place])   . Depression   . Osteoporosis    Past Surgical History:  Procedure Laterality Date  . APPENDECTOMY     Social History   Socioeconomic History  . Marital status: Married    Spouse name: Not on file  . Number of children: Not on file  . Years of education: Not on file  . Highest education Edwards: Not on file  Occupational History  . Not on file  Tobacco Use  . Smoking status: Never Smoker  . Smokeless tobacco: Never Used  Substance and Sexual Activity  . Alcohol use: Yes    Alcohol/week: 7.0 standard drinks    Types: 7 Glasses of wine per week    Comment: wine  . Drug use: No  . Sexual activity: Not on file  Other Topics Concern  . Not on file  Social History Narrative  . Not on file   Social  Determinants of Health   Financial Resource Strain:   . Difficulty of Paying Living Expenses:   Food Insecurity:   . Worried About Charity fundraiser in the Last Year:   . Arboriculturist in the Last Year:   Transportation Needs:   . Film/video editor (Medical):   Marland Kitchen Lack of Transportation (Non-Medical):   Physical Activity:   . Days of Exercise per Week:   . Minutes of Exercise per Session:   Stress:   . Feeling of Stress :   Social Connections:   . Frequency of Communication with Friends and Family:   . Frequency of Social Gatherings with Friends and Family:   . Attends Religious Services:   . Active Member of Clubs or Organizations:   . Attends Archivist Meetings:   Marland Kitchen Marital Status:    No Known Allergies Family History  Problem Relation Age of Onset  . Alcohol abuse Father   . Alcohol abuse Sister          Current Outpatient Medications (Other):  Marland Kitchen  buPROPion (WELLBUTRIN XL) 300 MG 24 hr tablet, Take 300 mg by mouth daily.   .  clobetasol ointment (TEMOVATE) 0.05 %,  .  sertraline (ZOLOFT) 50 MG tablet, Take 50 mg by mouth daily.  .  Vitamin D, Ergocalciferol, (DRISDOL) 1.25 MG (50000 UNIT) CAPS capsule, Take 1 capsule (50,000 Units total) by  mouth every 7 (seven) days.   Reviewed prior external information including notes and imaging from  primary care provider As well as notes that were available from care everywhere and other healthcare systems.  Past medical history, social, surgical and family history all reviewed in electronic medical record.  No pertanent information unless stated regarding to the chief complaint.   Review of Systems:  No headache, visual changes, nausea, vomiting, diarrhea, constipation, dizziness, abdominal pain, skin rash, fevers, chills, night sweats, weight loss, swollen lymph nodes, body aches, joint swelling, chest pain, shortness of breath, mood changes. POSITIVE muscle aches  Objective  Blood pressure 110/80,  pulse 64, height 5\' 5"  (1.651 m), weight 118 lb (53.5 kg), SpO2 98 %.   General: No apparent distress alert and oriented x3 mood and affect normal, dressed appropriately.  HEENT: Pupils equal, extraocular movements intact  Respiratory: Patient's speak in full sentences and does not appear short of breath  Cardiovascular: No lower extremity edema, non tender, no erythema  Neuro: Cranial nerves II through XII are intact, neurovascularly intact in all extremities with 2+ DTRs and 2+ pulses.  Gait normal with good balance and coordination.  MSK: Right leg exam shows the patient is favoring the leg.  Patient does have significant tightness of the hamstring compared to the contralateral side.  Moderate to severe tenderness at the piriformis but severe tenderness actually at the insertion hamstring tendon.  Patient does have pain with resisted flexion of the knee noted.  Back exam unremarkable.  Limited musculoskeletal ultrasound was performed and interpreted by Lyndal Pulley  Limited ultrasound shows that patient does have what appears to be an avulsion of the ischium noted.  Mildly displaced.  Minimal neovascularization but mild hypoechoic changes noted.    Impression and Recommendations:     The above documentation has been reviewed and is accurate and complete Lyndal Pulley, DO       Note: This dictation was prepared with Dragon dictation along with smaller phrase technology. Any transcriptional errors that result from this process are unintentional.

## 2019-08-08 NOTE — Patient Instructions (Addendum)
Good to see you Once weekly vitamin D K2 100-220mcg daily Thigh compression sleeve Heel lift in shoes 1/8th-1/16th  See me again in 5-6 weeks

## 2019-08-08 NOTE — Assessment & Plan Note (Signed)
Avulsion noted.  Patient does have about less than 1 cm of displacement noted.  Patient should do well with conservative therapy.  We will start with compression, home exercises.  Worsening pain will consider the possibility nitroglycerin.  Underlying osteoporosis to be contributing as well. We will closely follow.  4 to 6 weeks follow-up

## 2019-08-23 ENCOUNTER — Telehealth: Payer: Self-pay | Admitting: Internal Medicine

## 2019-08-23 MED ORDER — MOMETASONE FUROATE 0.1 % EX CREA: 1.0000 "application " | TOPICAL_CREAM | Freq: Every day | CUTANEOUS | 0 refills | Status: DC

## 2019-08-23 NOTE — Telephone Encounter (Signed)
LMTCB

## 2019-08-23 NOTE — Telephone Encounter (Signed)
Pt is aware that medication has been sent in.  

## 2019-08-23 NOTE — Telephone Encounter (Signed)
Patient just received new hearing aids and was told that her provider can prescribe a ear drop at night to help with itch during day.

## 2019-08-23 NOTE — Telephone Encounter (Signed)
Ointment sent to [facility]

## 2019-09-13 ENCOUNTER — Ambulatory Visit: Payer: Self-pay

## 2019-09-13 ENCOUNTER — Other Ambulatory Visit: Payer: Self-pay

## 2019-09-13 ENCOUNTER — Ambulatory Visit: Payer: PPO | Admitting: Family Medicine

## 2019-09-13 ENCOUNTER — Encounter: Payer: Self-pay | Admitting: Family Medicine

## 2019-09-13 VITALS — BP 100/60 | HR 75 | Ht 65.0 in | Wt 120.0 lb

## 2019-09-13 DIAGNOSIS — M79604 Pain in right leg: Secondary | ICD-10-CM | POA: Diagnosis not present

## 2019-09-13 DIAGNOSIS — S76391A Other specified injury of muscle, fascia and tendon of the posterior muscle group at thigh level, right thigh, initial encounter: Secondary | ICD-10-CM | POA: Diagnosis not present

## 2019-09-13 NOTE — Patient Instructions (Addendum)
Good to see you  Much better overall  Conitnue the vitamin D  Try the compression sleeve one more time PT will be calling you  See me again in 5-6 weeks

## 2019-09-13 NOTE — Assessment & Plan Note (Addendum)
Patient is doing relatively well will start formal physical therapy.  We discussed continuing the vitamin D.  I do see some callus formation.  Follow-up again in 4 to 8 weeks Discussed with patient that we could further evaluate with an MRI but patient declined with her making improvement and would not change medical management.  I do agree with this plan at follow-up will consider formal physical therapy versus advanced imaging

## 2019-09-13 NOTE — Progress Notes (Signed)
[facility] [place] Phone: [phone] Subjective:   Rito Ehrlich, am serving as a scribe for Dr. Hulan Saas. This visit occurred during the SARS-CoV-2 public health emergency.  Safety protocols were in place, including screening questions prior to the visit, additional usage of staff PPE, and extensive cleaning of exam room while observing appropriate contact time as indicated for disinfecting solutions.   I'm seeing this patient by the request  of:  Crecencio Mc, MD  CC: R Leg pain   [id number]  Joy [place] is a 64 y.o. female coming in with complaint of R leg pain states some better but still having problems sitting and would like to ride her horse.  Onset- 5-6 weeks ago  Location- buttock Aggravating factors- sitting       Past Medical History:  Diagnosis Date  . Cancer ([place])   . Depression   . Osteoporosis    Past Surgical History:  Procedure Laterality Date  . APPENDECTOMY     Social History   Socioeconomic History  . Marital status: Married    Spouse name: Not on file  . Number of children: Not on file  . Years of education: Not on file  . Highest education level: Not on file  Occupational History  . Not on file  Tobacco Use  . Smoking status: Never Smoker  . Smokeless tobacco: Never Used  Substance and Sexual Activity  . Alcohol use: Yes    Alcohol/week: 7.0 standard drinks    Types: 7 Glasses of wine per week    Comment: wine  . Drug use: No  . Sexual activity: Not on file  Other Topics Concern  . Not on file  Social History Narrative  . Not on file   Social Determinants of Health   Financial Resource Strain:   . Difficulty of Paying Living Expenses:   Food Insecurity:   . Worried About Charity fundraiser in the Last Year:   . Arboriculturist in the Last Year:   Transportation Needs:   . Film/video editor (Medical):   Marland Kitchen Lack of Transportation  (Non-Medical):   Physical Activity:   . Days of Exercise per Week:   . Minutes of Exercise per Session:   Stress:   . Feeling of Stress :   Social Connections:   . Frequency of Communication with Friends and Family:   . Frequency of Social Gatherings with Friends and Family:   . Attends Religious Services:   . Active Member of Clubs or Organizations:   . Attends Archivist Meetings:   Marland Kitchen Marital Status:    No Known Allergies Family History  Problem Relation Age of Onset  . Alcohol abuse Father   . Alcohol abuse Sister          Current Outpatient Medications (Other):  Marland Kitchen  buPROPion (WELLBUTRIN XL) 300 MG 24 hr tablet, Take 300 mg by mouth daily.   .  clobetasol ointment (TEMOVATE) 0.05 %,  .  mometasone (ELOCON) 0.1 % cream, Apply 1 application topically daily. .  sertraline (ZOLOFT) 50 MG tablet, Take 50 mg by mouth daily.  .  Vitamin D, Ergocalciferol, (DRISDOL) 1.25 MG (50000 UNIT) CAPS capsule, Take 1 capsule (50,000 Units total) by mouth every 7 (seven) days.   Reviewed prior external information including notes and imaging from  primary care provider As well as notes that were available from care everywhere and other  healthcare systems.  Past medical history, social, surgical and family history all reviewed in electronic medical record.  No pertanent information unless stated regarding to the chief complaint.   Review of Systems:  No headache, visual changes, nausea, vomiting, diarrhea, constipation, dizziness, abdominal pain, skin rash, fevers, chills, night sweats, weight loss, swollen lymph nodes, body aches, joint swelling, chest pain, shortness of breath, mood changes. POSITIVE muscle aches  Objective  Blood pressure 100/60, pulse 75, height 5\' 5"  (1.651 m), weight 120 lb (54.4 kg), SpO2 97 %.   General: No apparent distress alert and oriented x3 mood and affect normal, dressed appropriately.  HEENT: Pupils equal, extraocular movements intact    Respiratory: Patient's speak in full sentences and does not appear short of breath  Cardiovascular: No lower extremity edema, non tender, no erythema  Neuro: Cranial nerves II through XII are intact, neurovascularly intact in all extremities with 2+ DTRs and 2+ pulses.  Gait normal with good balance and coordination.  MSK:  Non tender with full range of motion and good stability and symmetric strength and tone of shoulders, elbows, wrist, , and ankles bilaterally.  Right hip exam shows the patient is still tender over the ischium itself.  Does have some pain with resisted flexion of the hamstring.  No gapping appreciated in the area.  Glued is unremarkable.  Limited musculoskeletal ultrasound was performed and interpreted by Lyndal Pulley  Limited ultrasound shows the patient's avulsion does have some callus formation with hard callus actually already noted.  Some increase in no hypoechoic changes at the origin of the hamstring tendon but no true retraction but difficult to assess secondary to the hypoechoic changes.   Impression and Recommendations:     The above documentation has been reviewed and is accurate and complete Lyndal Pulley, DO       Note: This dictation was prepared with Dragon dictation along with smaller phrase technology. Any transcriptional errors that result from this process are unintentional.

## 2019-09-19 ENCOUNTER — Telehealth: Payer: Self-pay | Admitting: Family Medicine

## 2019-09-19 NOTE — Telephone Encounter (Signed)
Patient called stating that she was referred for PT and Dry Needling in [place] but they are not able to see her for another month. Is there somewhere else that she can go and possibly be seen sooner?

## 2019-09-19 NOTE — Telephone Encounter (Signed)
Sent patient MyChart message with Nicole Kindred PT as a suggestion. Awaiting patient's response.

## 2019-09-28 ENCOUNTER — Other Ambulatory Visit: Payer: Self-pay

## 2019-09-28 ENCOUNTER — Ambulatory Visit: Payer: PPO | Attending: Family Medicine

## 2019-09-28 DIAGNOSIS — M6281 Muscle weakness (generalized): Secondary | ICD-10-CM | POA: Insufficient documentation

## 2019-09-28 DIAGNOSIS — M79604 Pain in right leg: Secondary | ICD-10-CM | POA: Diagnosis not present

## 2019-09-28 DIAGNOSIS — R252 Cramp and spasm: Secondary | ICD-10-CM

## 2019-09-28 NOTE — Patient Instructions (Signed)

## 2019-09-28 NOTE — Therapy (Signed)
[facility] [address], [place], [place], [address] Phone: [phone]   Fax:  [phone]  Physical Therapy Evaluation  Patient Details  Name: Joy [place] MRN: [id number] Date of Birth: 19-Jul-1955 Referring Provider (PT): Hulan Saas, MD   Encounter Date: 09/28/2019   PT End of Session - 09/28/19 0934    Visit Number 1    Date for PT Re-Evaluation 11/23/19    Authorization Type Medicare    PT Start Time 0847    PT Stop Time 0928    PT Time Calculation (min) 41 min    Activity Tolerance Patient tolerated treatment well    Behavior During Therapy [facility] for tasks assessed/performed           Past Medical History:  Diagnosis Date  . Cancer ([place])   . Depression   . Osteoporosis     Past Surgical History:  Procedure Laterality Date  . APPENDECTOMY      There were no vitals filed for this visit.    Subjective Assessment - 09/28/19 0848    Subjective Pt presents with Rt hamstring avulsion injury that occured when she slipped on a wet deck on vacation on 06/12/19.  I have been taking vitamin D and wearing compression shorts.    How long can you sit comfortably? 1 hour with cushion under    Diagnostic tests x-ray: avulsion    Patient Stated Goals reduce pain, sit without limitation, stand and walk without pain.    Currently in Pain? Yes    Pain Score 6    6-10/10   Pain Location Leg   hamstring insertion   Pain Orientation Right    Pain Descriptors / Indicators Shooting    Pain Type Chronic pain    Pain Onset More than a month ago    Pain Frequency Constant    Aggravating Factors  sitting, walking    Pain Relieving Factors standing, topical rub              [facility] PT Assessment - 09/28/19 0001      Assessment   Medical Diagnosis avulsion of hamstring muscle, Rt    Referring Provider (PT) Hulan Saas, MD    Onset Date/Surgical Date 06/12/19    Next MD Visit 11/01/19      Precautions    Precautions Other (comment)    Precaution Comments osteoporosis      Restrictions   Weight Bearing Restrictions No      Balance Screen   Has the patient fallen in the past 6 months No    Has the patient had a decrease in activity level because of a fear of falling?  No    Is the patient reluctant to leave their home because of a fear of falling?  No      Home Environment   Living Environment Private residence    Living Arrangements Spouse/significant other    Type of [place] to enter    Home Layout Two level      Prior Function   Level of [place] Retired    Leisure walking, riding horses, puzzles      Cognition   Overall Cognitive Status Within Functional Limits for tasks assessed      Observation/Other Assessments   Focus on Therapeutic Outcomes (FOTO)  73% limitation      Posture/Postural Control   Posture/Postural Control No significant limitations  Palpation   Palpation comment palpable tenderness over Rt medial hamstring insertion and trigger points along the medial hmstring muscle belly.  Trigger points in proximal Rt gluteals      Transfers   Transfers Independent with all Transfers    Comments Rt and Lt single leg stance x 10 seconds with instability at the hip bilaterally      Ambulation/Gait   Gait Pattern Within Functional Limits                      Objective measurements completed on examination: See above findings.       [facility] Adult PT Treatment/Exercise - 09/28/19 0001      Manual Therapy   Manual Therapy Soft tissue mobilization;Myofascial release    Manual therapy comments palpation and monitoring with dry needling    Soft tissue mobilization elongation and trigger point release to Rt hamstring and gluteals            Trigger Point Dry Needling - 09/28/19 0001    Consent Given? Yes    Education Handout Provided Previously provided    Muscles Treated Lower Quadrant  Hamstring   Rt only   Muscles Treated Back/Hip Gluteus minimus;Gluteus medius   Rt only   Hamstring Response Twitch response elicited;Palpable increased muscle length    Gluteus Minimus Response Twitch response elicited;Palpable increased muscle length    Gluteus Medius Response Twitch response elicited;Palpable increased muscle length                PT Education - 09/28/19 0912    Education Details DN info, review of HEP issued by MD -seated figure 4, HS stretch, quad stretch, sidelying clam, wall slide    Person(s) Educated Patient    Methods Explanation;Demonstration;Handout    Comprehension Verbalized understanding;Returned demonstration            PT Short Term Goals - 09/28/19 0841      PT SHORT TERM GOAL #1   Title be independent in initial HEP    Time 4    Period Weeks    Status New    Target Date 10/26/19      PT SHORT TERM GOAL #2   Title report a 30% reduction in Rt LE with walking and negotiating steps    Time 4    Period Weeks    Status New    Target Date 10/26/19      PT SHORT TERM GOAL #3   Title report < or = to 5/10 Rt LE pain with sitting    Time 4    Period Weeks    Status New    Target Date 10/26/19             PT Long Term Goals - 09/28/19 0841      PT LONG TERM GOAL #1   Title be independent in advanced HEP    Time 8    Period Weeks    Status New    Target Date 11/23/19      PT LONG TERM GOAL #2   Title reduce FOTO to < or = to 44% limitation    Time 8    Period Weeks    Status New    Target Date 11/23/19      PT LONG TERM GOAL #3   Title report a 70% reduction in Rt LE pain with walking and steps    Time 8    Period Weeks    Status New  Target Date 11/23/19      PT LONG TERM GOAL #4   Title sit without limitation due to Rt LE pain    Time 8    Period Weeks    Status New    Target Date 11/23/19      PT LONG TERM GOAL #5   Title return to riding horses without limitation due to Rt LE pain    Time 8    Period  Weeks    Status New    Target Date 11/23/19                  Plan - 09/28/19 0947    Clinical Impression Statement Pt presents to PT with Rt hamstring avulsion tear due to a slip on a wet deck 06/12/19.  Pt has been treating with compression sleeve, Vitamin D exercises issued by the MD.  Pt reports limited function and increased Rt hamstring insertion pain since the time of injury.  Pt reports 6-10/10 Rt hamstring insertion pain that increases with sitting (especially when not on a cushion), and with walking (especially on inclines or steps). Pt demonstrates symmetry with ambulation and Rt=Lt hip A/ROM with Rt hamstring pain reported with end range motion.  Pt with 4+/5 Rt hip and knee strength and 5/5 Lt LE strength.  Pt with palpable tension and trigger points in Rt gluteals and medial hamstrings.  Pt will benefit from skilled PT to reduce Rt LE pain and improve tolerance for sitting, walking and riding horse with reduced pain.    Personal Factors and Comorbidities Age    Examination-Activity Limitations Locomotion Level;Sit    Examination-Participation Restrictions Tour manager    Stability/Clinical Decision Making Stable/Uncomplicated    Clinical Decision Making Low    Rehab Potential Excellent    PT Frequency 2x / week    PT Duration 8 weeks    PT Treatment/Interventions ADLs/Self Care Home Management;Cryotherapy;Electrical Stimulation;Moist Heat;Neuromuscular re-education;Balance training;Therapeutic activities;Functional mobility training;Stair training;Gait training;Manual techniques;Dry needling;Passive range of motion;Taping    PT Next Visit Plan add hip stabilization exercises, assess response to dry needling and repeat if helpful.  Discuss tissue mobilization    PT Home Exercise Plan review of HEP issued by MD    Consulted and Agree with Plan of Care Patient           Patient will benefit from skilled therapeutic intervention in order to improve the following deficits  and impairments:  Decreased balance, Decreased strength, Pain, Decreased activity tolerance, Decreased endurance, Increased muscle spasms, Difficulty walking  Visit Diagnosis: Pain in right leg - Plan: PT plan of care cert/re-cert  Cramp and spasm - Plan: PT plan of care cert/re-cert  Muscle weakness (generalized) - Plan: PT plan of care cert/re-cert     Problem List Patient Active Problem List   Diagnosis Date Noted  . Avulsion of hamstring muscle, right, initial encounter 08/08/2019  . Right buttock pain 07/24/2019  . Hyperlipidemia associated with type 2 diabetes mellitus ([place]) 07/24/2019  . Sensorineural hearing loss (SNHL) of both ears 07/24/2019  . 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 History of nonmelanoma skin cancer 05/10/2017  . Vitamin D deficiency 03/29/2014  . Routine general medical examination at a health care facility 06/30/2012  . Insomnia secondary to depression with anxiety 06/30/2012  . Post-menopausal osteoporosis      Sigurd Sos, PT 09/28/19 10:13 AM  [facility] [address] [place], [place], [address] Phone: [phone]   Fax:  [phone]  Name: VERMELLE CAMMARATA MRN: [id number] Date of Birth: Oct 12, 1955

## 2019-10-02 ENCOUNTER — Ambulatory Visit: Payer: PPO

## 2019-10-02 ENCOUNTER — Other Ambulatory Visit: Payer: Self-pay

## 2019-10-02 DIAGNOSIS — M79604 Pain in right leg: Secondary | ICD-10-CM

## 2019-10-02 DIAGNOSIS — R252 Cramp and spasm: Secondary | ICD-10-CM

## 2019-10-02 DIAGNOSIS — M6281 Muscle weakness (generalized): Secondary | ICD-10-CM

## 2019-10-02 NOTE — Therapy (Signed)
[facility] [address] [place], [place], [address] Phone: [phone]   Fax:  [phone]  Physical Therapy Treatment  Patient Details  Name: Joy [place] MRN: [id number] Date of Birth: 1955/07/06 Referring Provider (PT): Hulan Saas, MD   Encounter Date: 10/02/2019   PT End of Session - 10/02/19 1441    Visit Number 2    Date for PT Re-Evaluation 11/23/19    Authorization Type Medicare    PT Start Time 1401   dry needling   PT Stop Time 3893    PT Time Calculation (min) 38 min    Activity Tolerance Patient tolerated treatment well    Behavior During Therapy [facility] for tasks assessed/performed           Past Medical History:  Diagnosis Date  . Cancer ([place])   . Depression   . Osteoporosis     Past Surgical History:  Procedure Laterality Date  . APPENDECTOMY      There were no vitals filed for this visit.   Subjective Assessment - 10/02/19 1400    Subjective Things are about the same.    Currently in Pain? Yes    Pain Score 6     Pain Location Leg    Pain Orientation Right    Pain Descriptors / Indicators Shooting    Pain Type Chronic pain                             [facility] Adult PT Treatment/Exercise - 10/02/19 0001      Exercises   Exercises Knee/Hip;Lumbar      Lumbar Exercises: Stretches   Active Hamstring Stretch Right;2 reps;30 seconds    Active Hamstring Stretch Limitations seated    Piriformis Stretch Right;2 reps;30 seconds    Piriformis Stretch Limitations seated       Knee/Hip Exercises: Standing   SLS 10 seconds x 5 each    Other Standing Knee Exercises single limb T for Rt LE stability.       Manual Therapy   Manual Therapy Soft tissue mobilization;Myofascial release    Manual therapy comments palpation and monitoring with dry needling    Soft tissue mobilization elongation and trigger point release to Rt hamstring and gluteals             Trigger Point Dry Needling - 10/02/19 0001    Consent Given? Yes    Education Handout Provided Previously provided    Muscles Treated Lower Quadrant Hamstring   Rt only   Muscles Treated Back/Hip Gluteus minimus;Gluteus medius   Rt only   Hamstring Response Twitch response elicited;Palpable increased muscle length    Gluteus Minimus Response Twitch response elicited;Palpable increased muscle length    Gluteus Medius Response Twitch response elicited;Palpable increased muscle length                PT Education - 10/02/19 1412    Education Details Access Code: [email or web address]    Person(s) Educated Patient    Methods Explanation;Demonstration;Handout    Comprehension Verbalized understanding;Returned demonstration            PT Short Term Goals - 09/28/19 0841      PT SHORT TERM GOAL #1   Title be independent in initial HEP    Time 4    Period Weeks    Status New    Target Date 10/26/19      PT SHORT TERM GOAL #2  Title report a 30% reduction in Rt LE with walking and negotiating steps    Time 4    Period Weeks    Status New    Target Date 10/26/19      PT SHORT TERM GOAL #3   Title report < or = to 5/10 Rt LE pain with sitting    Time 4    Period Weeks    Status New    Target Date 10/26/19             PT Long Term Goals - 09/28/19 0841      PT LONG TERM GOAL #1   Title be independent in advanced HEP    Time 8    Period Weeks    Status New    Target Date 11/23/19      PT LONG TERM GOAL #2   Title reduce FOTO to < or = to 44% limitation    Time 8    Period Weeks    Status New    Target Date 11/23/19      PT LONG TERM GOAL #3   Title report a 70% reduction in Rt LE pain with walking and steps    Time 8    Period Weeks    Status New    Target Date 11/23/19      PT LONG TERM GOAL #4   Title sit without limitation due to Rt LE pain    Time 8    Period Weeks    Status New    Target Date 11/23/19      PT LONG TERM GOAL #5   Title return to riding  horses without limitation due to Rt LE pain    Time 8    Period Weeks    Status New    Target Date 11/23/19                 Plan - 10/02/19 1444    Clinical Impression Statement Pt with first time follow-up after evaluation.  Pt remains consistent with HEP issued by MD and PT reviewed with pt again today, answered questions and provided cueing for form. PT added stability exercises for single limb on the Rt.  Pt with tension in Rt medial hamstring distal to avulsion and Rt proximal gluteals.  Pt demonstrates improved tissue mobility and reduced tension after manual therapy and dry needling today.  Pt will continue to benefit from skilled PT to address Rt hamstring avulsion and strength and tissue mobility associated with this.    PT Frequency 2x / week    PT Duration 8 weeks    PT Treatment/Interventions ADLs/Self Care Home Management;Cryotherapy;Electrical Stimulation;Moist Heat;Neuromuscular re-education;Balance training;Therapeutic activities;Functional mobility training;Stair training;Gait training;Manual techniques;Dry needling;Passive range of motion;Taping    PT Next Visit Plan review hip stabilization exercises, assess response to dry needling and repeat if helpful.  Discuss tissue mobilization    PT Home Exercise Plan Access Code: [email or web address]    Consulted and Agree with Plan of Care Patient           Patient will benefit from skilled therapeutic intervention in order to improve the following deficits and impairments:  Decreased balance, Decreased strength, Pain, Decreased activity tolerance, Decreased endurance, Increased muscle spasms, Difficulty walking  Visit Diagnosis: Pain in right leg  Cramp and spasm  Muscle weakness (generalized)     Problem List Patient Active Problem List   Diagnosis Date Noted  . Avulsion of hamstring muscle, right, initial encounter 08/08/2019  .  Right buttock pain 07/24/2019  . Hyperlipidemia associated with type 2 diabetes mellitus  ([place]) 07/24/2019  . Sensorineural hearing loss (SNHL) of both ears 07/24/2019  . 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 History of nonmelanoma skin cancer 05/10/2017  . Vitamin D deficiency 03/29/2014  . Routine general medical examination at a health care facility 06/30/2012  . Insomnia secondary to depression with anxiety 06/30/2012  . Post-menopausal osteoporosis     Sigurd Sos, PT 10/02/19 2:44 PM  [facility] [address], [place], [place], [address] Phone: [phone]   Fax:  [phone]  Name: ELESE RANE MRN: [id number] Date of Birth: 05-10-1955

## 2019-10-02 NOTE — Patient Instructions (Signed)
Access Code: [email or web address] URL: [email or web address] Date: 10/02/2019 Prepared by: Claiborne Billings  Exercises Seated Hamstring Stretch - 3 x daily - 7 x weekly - 1 sets - 3 reps - 20-30 hold Seated Figure 4 Piriformis Stretch - 3 x daily - 7 x weekly - 1 sets - 3 reps - 20-30 hold Single Leg Stance - 3 x daily - 7 x weekly - 1 sets - 3-5 reps - 20 hold Forward T - 1 x daily - 7 x weekly - 1 sets - 10 reps - 3 hold

## 2019-10-12 ENCOUNTER — Ambulatory Visit: Payer: PPO | Attending: Family Medicine

## 2019-10-12 ENCOUNTER — Other Ambulatory Visit: Payer: Self-pay

## 2019-10-12 DIAGNOSIS — M79604 Pain in right leg: Secondary | ICD-10-CM | POA: Diagnosis not present

## 2019-10-12 DIAGNOSIS — R252 Cramp and spasm: Secondary | ICD-10-CM | POA: Diagnosis not present

## 2019-10-12 DIAGNOSIS — M6281 Muscle weakness (generalized): Secondary | ICD-10-CM | POA: Diagnosis not present

## 2019-10-12 NOTE — Therapy (Signed)
[facility] [address] [place], [place], [address] Phone: [phone]   Fax:  [phone]  Physical Therapy Treatment  Patient Details  Name: Joy Edwards MRN: [id number] Date of Birth: [birth year] Referring Provider (PT): Hulan Saas, MD   Encounter Date: 10/12/2019   PT End of Session - 10/12/19 1310    Visit Number 3    Date for PT Re-Evaluation 11/23/19    Authorization Type Medicare    PT Start Time 1937    PT Stop Time 1307    PT Time Calculation (min) 36 min    Activity Tolerance Patient tolerated treatment well    Behavior During Therapy [facility] for tasks assessed/performed           Past Medical History:  Diagnosis Date  . Cancer ([place])   . Depression   . Osteoporosis     Past Surgical History:  Procedure Laterality Date  . APPENDECTOMY      There were no vitals filed for this visit.   Subjective Assessment - 10/12/19 1234    Subjective I think that I am getting better.  I am not sitting on my cushion all the time.  I feel 60-70% better.    Currently in Pain? Yes    Pain Score 2     Pain Location Leg    Pain Orientation Right    Pain Descriptors / Indicators Shooting    Pain Type Chronic pain    Pain Onset More than a month ago    Pain Frequency Constant    Aggravating Factors  sitting, walking    Pain Relieving Factors standing, topical rub                             [facility] Adult PT Treatment/Exercise - 10/12/19 0001      Manual Therapy   Manual Therapy Soft tissue mobilization;Myofascial release    Manual therapy comments palpation and monitoring with dry needling    Soft tissue mobilization elongation and trigger point release to Rt hamstring and gluteals            Trigger Point Dry Needling - 10/12/19 0001    Consent Given? Yes    Education Handout Provided Previously provided    Muscles Treated Lower Quadrant Hamstring   Rt only   Muscles Treated  Back/Hip Gluteus minimus;Gluteus medius   Rt only   Hamstring Response Twitch response elicited;Palpable increased muscle length    Gluteus Minimus Response Twitch response elicited;Palpable increased muscle length    Gluteus Medius Response Twitch response elicited;Palpable increased muscle length                  PT Short Term Goals - 10/12/19 1236      PT SHORT TERM GOAL #1   Title be independent in initial HEP    Status Achieved      PT SHORT TERM GOAL #2   Title report a 30% reduction in Rt LE with walking and negotiating steps    Baseline 60-70%    Status Achieved      PT SHORT TERM GOAL #3   Title report < or = to 5/10 Rt LE pain with sitting    Baseline up to 5/10 max 1x this week    Status Achieved             PT Long Term Goals - 09/28/19 9024  PT LONG TERM GOAL #1   Title be independent in advanced HEP    Time 8    Period Weeks    Status New    Target Date 11/23/19      PT LONG TERM GOAL #2   Title reduce FOTO to < or = to 44% limitation    Time 8    Period Weeks    Status New    Target Date 11/23/19      PT LONG TERM GOAL #3   Title report a 70% reduction in Rt LE pain with walking and steps    Time 8    Period Weeks    Status New    Target Date 11/23/19      PT LONG TERM GOAL #4   Title sit without limitation due to Rt LE pain    Time 8    Period Weeks    Status New    Target Date 11/23/19      PT LONG TERM GOAL #5   Title return to riding horses without limitation due to Rt LE pain    Time 8    Period Weeks    Status New    Target Date 11/23/19                 Plan - 10/12/19 1309    Clinical Impression Statement Pt reports 60-70% overall improvement in Rt hamstring pain since the start of care.  Pt remains consistent with HEP issued by MD and PT reviewed and pt has become more active over the past week.  Pt reports a ax of 5/10 pain just one time this week with sitting.  Pt with tension in Rt medial hamstring distal  to avulsion and Rt proximal gluteals.  Pt demonstrates improved tissue mobility and reduced tension after manual therapy and dry needling today.  Pt will continue to benefit from skilled PT to address Rt hamstring avulsion and strength and tissue mobility associated with this.    PT Frequency 2x / week    PT Duration 8 weeks    PT Treatment/Interventions ADLs/Self Care Home Management;Cryotherapy;Electrical Stimulation;Moist Heat;Neuromuscular re-education;Balance training;Therapeutic activities;Functional mobility training;Stair training;Gait training;Manual techniques;Dry needling;Passive range of motion;Taping    PT Next Visit Plan review hip stabilization exercises, assess response to dry needling and repeat if helpful.    PT Home Exercise Plan Access Code: [email or web address]    Consulted and Agree with Plan of Care Patient           Patient will benefit from skilled therapeutic intervention in order to improve the following deficits and impairments:  Decreased balance, Decreased strength, Pain, Decreased activity tolerance, Decreased endurance, Increased muscle spasms, Difficulty walking  Visit Diagnosis: Pain in right leg  Cramp and spasm  Muscle weakness (generalized)     Problem List Patient Active Problem List   Diagnosis Date Noted  . Avulsion of hamstring muscle, right, initial encounter 08/08/2019  . Right buttock pain 07/24/2019  . Hyperlipidemia associated with type 2 diabetes mellitus ([place]) 07/24/2019  . Sensorineural hearing loss (SNHL) of both ears 07/24/2019  . 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 History of nonmelanoma skin cancer 05/10/2017  . Vitamin D deficiency 03/29/2014  . Routine general medical examination at a health care facility 06/30/2012  . Insomnia secondary to depression with anxiety 06/30/2012  . Post-menopausal osteoporosis      Sigurd Sos, PT 10/12/19 1:11 PM  [facility] [address]  [address] [place],  [place], [address] Phone: [phone]   Fax:  [phone]  Name: Joy Edwards MRN: [id number] Date of Birth: 03-14-1955

## 2019-10-16 ENCOUNTER — Ambulatory Visit: Payer: PPO

## 2019-10-31 ENCOUNTER — Other Ambulatory Visit: Payer: Self-pay

## 2019-10-31 ENCOUNTER — Ambulatory Visit: Payer: PPO | Admitting: Family Medicine

## 2019-10-31 ENCOUNTER — Encounter: Payer: Self-pay | Admitting: Family Medicine

## 2019-10-31 ENCOUNTER — Ambulatory Visit: Payer: Self-pay

## 2019-10-31 VITALS — BP 124/60 | HR 76 | Ht 65.0 in | Wt 120.0 lb

## 2019-10-31 DIAGNOSIS — S76309D Unspecified injury of muscle, fascia and tendon of the posterior muscle group at thigh level, unspecified thigh, subsequent encounter: Secondary | ICD-10-CM | POA: Diagnosis not present

## 2019-10-31 DIAGNOSIS — S76391A Other specified injury of muscle, fascia and tendon of the posterior muscle group at thigh level, right thigh, initial encounter: Secondary | ICD-10-CM

## 2019-10-31 NOTE — Assessment & Plan Note (Signed)
Patient still shows some mild callus formation still over the hamstring itself.  There is some mild increase in hypoechoic changes.  Patient has great strength though so I do not think that any advanced imaging would change medical management at this time.  Patient is going to start increasing her activity again.  Discussed icing regimen, home exercises, which activities to do which wants to avoid.  Patient will increase activity slowly.  Patient can follow-up as needed.

## 2019-10-31 NOTE — Progress Notes (Signed)
[place] [address] [place] Phone: [phone] Subjective:   I Joy Edwards am serving as a Education administrator for Dr. Hulan Saas.  This visit occurred during the SARS-CoV-2 public health emergency.  Safety protocols were in place, including screening questions prior to the visit, additional usage of staff PPE, and extensive cleaning of exam room while observing appropriate contact time as indicated for disinfecting solutions.   I'm seeing this patient by the request  of:  Crecencio Mc, MD  CC: Leg pain follow-up  [id number]   09/13/2019 Patient is doing relatively well will start formal physical therapy.  We discussed continuing the vitamin D.  I do see some callus formation.  Follow-up again in 4 to 8 weeks Discussed with patient that we could further evaluate with an MRI but patient declined with her making improvement and would not change medical management.  I do agree with this plan at follow-up will consider formal physical therapy versus advanced imaging  Update 10/31/2019 Joy Edwards is a 64 y.o. female coming in with complaint of right hamstring pain. Patient states she is feeling better. Sitting is painful. Patient has been going to formal physical therapy.      Past Medical History:  Diagnosis Date  . Cancer ([place])   . Depression   . Osteoporosis    Past Surgical History:  Procedure Laterality Date  . APPENDECTOMY     Social History   Socioeconomic History  . Marital status: Married    Spouse name: Not on file  . Number of children: Not on file  . Years of education: Not on file  . Highest education level: Not on file  Occupational History  . Not on file  Tobacco Use  . Smoking status: Never Smoker  . Smokeless tobacco: Never Used  Substance and Sexual Activity  . Alcohol use: Yes    Alcohol/week: 7.0 standard drinks    Types: 7 Glasses of wine per week    Comment: wine  . Drug use: No  . Sexual  activity: Not on file  Other Topics Concern  . Not on file  Social History Narrative  . Not on file   Social Determinants of Health   Financial Resource Strain:   . Difficulty of Paying Living Expenses: Not on file  Food Insecurity:   . Worried About Charity fundraiser in the Last Year: Not on file  . Ran Out of Food in the Last Year: Not on file  Transportation Needs:   . Lack of Transportation (Medical): Not on file  . Lack of Transportation (Non-Medical): Not on file  Physical Activity:   . Days of Exercise per Week: Not on file  . Minutes of Exercise per Session: Not on file  Stress:   . Feeling of Stress : Not on file  Social Connections:   . Frequency of Communication with Friends and Family: Not on file  . Frequency of Social Gatherings with Friends and Family: Not on file  . Attends Religious Services: Not on file  . Active Member of Clubs or Organizations: Not on file  . Attends Archivist Meetings: Not on file  . Marital Status: Not on file   No Known Allergies Family History  Problem Relation Age of Onset  . Alcohol abuse Father   . Alcohol abuse Sister          Current Outpatient Medications (Other):  Marland Kitchen  buPROPion (WELLBUTRIN XL) 300 MG  24 hr tablet, Take 300 mg by mouth daily.   .  clobetasol ointment (TEMOVATE) 0.05 %,  .  mometasone (ELOCON) 0.1 % cream, Apply 1 application topically daily. .  sertraline (ZOLOFT) 50 MG tablet, Take 50 mg by mouth daily.  .  Vitamin D, Ergocalciferol, (DRISDOL) 1.25 MG (50000 UNIT) CAPS capsule, Take 1 capsule (50,000 Units total) by mouth every 7 (seven) days.   Reviewed prior external information including notes and imaging from  primary care provider As well as notes that were available from care everywhere and other healthcare systems.  Past medical history, social, surgical and family history all reviewed in electronic medical record.  No pertanent information unless stated regarding to the chief  complaint.   Review of Systems:  No headache, visual changes, nausea, vomiting, diarrhea, constipation, dizziness, abdominal pain, skin rash, fevers, chills, night sweats, weight loss, swollen lymph nodes, body aches, joint swelling, chest pain, shortness of breath, mood changes. POSITIVE muscle aches  Objective  Blood pressure 124/60, pulse 76, height 5\' 5"  (1.651 m), weight 120 lb (54.4 kg), SpO2 96 %.   General: No apparent distress alert and oriented x3 mood and affect normal, dressed appropriately.  HEENT: Pupils equal, extraocular movements intact  Respiratory: Patient's speak in full sentences and does not appear short of breath  Cardiovascular: No lower extremity edema, non tender, no erythema  Neuro: Cranial nerves II through XII are intact, neurovascularly intact in all extremities with 2+ DTRs and 2+ pulses.  Gait  MSK: Right hamstring shows the patient has good strength.  Still minimally tender over the ischial area but otherwise unremarkable.   Limited musculoskeletal ultrasound was performed and interpreted by Lyndal Pulley  Limited ultrasound shows patient does have a callus formation over the ischial at the origin of the hamstring.  Patient is seems to have a hard callus but is fairly thick.  Increasing neovascularization in the area. Impression: Interval healing.   Impression and Recommendations:     The above documentation has been reviewed and is accurate and complete Lyndal Pulley, DO       Note: This dictation was prepared with Dragon dictation along with smaller phrase technology. Any transcriptional errors that result from this process are unintentional.

## 2019-11-03 DIAGNOSIS — Z08 Encounter for follow-up examination after completed treatment for malignant neoplasm: Secondary | ICD-10-CM | POA: Diagnosis not present

## 2019-11-03 DIAGNOSIS — L570 Actinic keratosis: Secondary | ICD-10-CM | POA: Diagnosis not present

## 2019-11-03 DIAGNOSIS — L03011 Cellulitis of right finger: Secondary | ICD-10-CM | POA: Diagnosis not present

## 2019-11-03 DIAGNOSIS — Z872 Personal history of diseases of the skin and subcutaneous tissue: Secondary | ICD-10-CM | POA: Diagnosis not present

## 2019-11-03 DIAGNOSIS — L720 Epidermal cyst: Secondary | ICD-10-CM | POA: Diagnosis not present

## 2019-11-03 DIAGNOSIS — Z20828 Contact with and (suspected) exposure to other viral communicable diseases: Secondary | ICD-10-CM | POA: Diagnosis not present

## 2019-11-03 DIAGNOSIS — L03012 Cellulitis of left finger: Secondary | ICD-10-CM | POA: Diagnosis not present

## 2019-11-03 DIAGNOSIS — Z85828 Personal history of other malignant neoplasm of skin: Secondary | ICD-10-CM | POA: Diagnosis not present

## 2019-11-06 ENCOUNTER — Ambulatory Visit: Payer: PPO | Attending: Family Medicine

## 2019-11-06 ENCOUNTER — Other Ambulatory Visit: Payer: Self-pay

## 2019-11-06 DIAGNOSIS — R252 Cramp and spasm: Secondary | ICD-10-CM | POA: Diagnosis not present

## 2019-11-06 DIAGNOSIS — M6281 Muscle weakness (generalized): Secondary | ICD-10-CM | POA: Diagnosis not present

## 2019-11-06 DIAGNOSIS — M79604 Pain in right leg: Secondary | ICD-10-CM | POA: Insufficient documentation

## 2019-11-06 NOTE — Therapy (Signed)
[facility] [address] [place], [place], [address] Phone: [phone]   Fax:  [phone]  Physical Therapy Treatment  Patient Details  Name: Joy Edwards MRN: [id number] Date of Birth: May 18, 1955 Referring Provider (PT): Hulan Saas, MD   Encounter Date: 11/06/2019   PT End of Session - 11/06/19 1103    Visit Number 4    PT Start Time 3903    PT Stop Time 1059    PT Time Calculation (min) 40 min    Activity Tolerance Patient tolerated treatment well    Behavior During Therapy [facility] for tasks assessed/performed           Past Medical History:  Diagnosis Date  . Cancer ([place])   . Depression   . Osteoporosis     Past Surgical History:  Procedure Laterality Date  . APPENDECTOMY      There were no vitals filed for this visit.   Subjective Assessment - 11/06/19 1019    Subjective I have been released from the MD.  My hamstring is healed and now has a calcium deposit so it feels hard when I sit.  I am 90% better.    Patient Stated Goals reduce pain, sit without limitation, stand and walk without pain.    Currently in Pain? Yes    Pain Location Leg    Pain Orientation Right    Pain Type Chronic pain    Pain Onset More than a month ago    Pain Frequency Constant    Aggravating Factors  cleaning horse stalls, sitting    Pain Relieving Factors standing, sitting on cushion              [facility] PT Assessment - 11/06/19 0001      Assessment   Medical Diagnosis avulsion of hamstring muscle, Rt    Referring Provider (PT) Hulan Saas, MD      Prior Function   Level of Independence Independent    Vocation Retired    Leisure walking, riding horses, puzzles      Cognition   Overall Cognitive Status Within Functional Limits for tasks assessed                         [facility] Adult PT Treatment/Exercise - 11/06/19 0001      Manual Therapy   Manual Therapy Soft tissue  mobilization;Myofascial release    Manual therapy comments palpation and monitoring with dry needling    Soft tissue mobilization elongation and trigger point release to Rt hamstrings            Trigger Point Dry Needling - 11/06/19 0001    Consent Given? Yes    Education Handout Provided Previously provided    Muscles Treated Lower Quadrant Hamstring   Rt only   Hamstring Response Twitch response elicited;Palpable increased muscle length                PT Education - 11/06/19 1103    Education Details verbal review of HEP    Person(s) Educated Patient    Methods Explanation    Comprehension Verbalized understanding            PT Short Term Goals - 10/12/19 1236      PT SHORT TERM GOAL #1   Title be independent in initial HEP    Status Achieved      PT SHORT TERM GOAL #2   Title report a 30% reduction in  Rt LE with walking and negotiating steps    Baseline 60-70%    Status Achieved      PT SHORT TERM GOAL #3   Title report < or = to 5/10 Rt LE pain with sitting    Baseline up to 5/10 max 1x this week    Status Achieved             PT Long Term Goals - 11/06/19 1021      PT LONG TERM GOAL #1   Title be independent in advanced HEP    Status Achieved      PT LONG TERM GOAL #3   Title report a 70% reduction in Rt LE pain with walking and steps    Baseline 90%    Status Achieved      PT LONG TERM GOAL #4   Title sit without limitation due to Rt LE pain      PT LONG TERM GOAL #5   Title return to riding horses without limitation due to Rt LE pain    Baseline horse is sick so not able to    Status Deferred                 Plan - 11/06/19 1101    Clinical Impression Statement Pt has been released from MD and hamstring is healed.  Pt reports 90% overall improvement since the start of care and is ready for D/C.  Pt has returned to her regular activities and is not limited although performing heavier tasks more slowly.  Pt with tension at  proximal hamstring insertion and demonstrated improved tissue mobility after manual therapy and dry needling today.  Pt will continue with flexibility and stability exercises.    PT Next Visit Plan D/C PT to HEP    PT Home Exercise Plan Access Code: [email or web address]    Consulted and Agree with Plan of Care Patient           Patient will benefit from skilled therapeutic intervention in order to improve the following deficits and impairments:     Visit Diagnosis: Cramp and spasm  Pain in right leg  Muscle weakness (generalized)     Problem List Patient Active Problem List   Diagnosis Date Noted  . Avulsion of hamstring muscle, right, initial encounter 08/08/2019  . Right buttock pain 07/24/2019  . Hyperlipidemia associated with type 2 diabetes mellitus ([place]) 07/24/2019  . Sensorineural hearing loss (SNHL) of both ears 07/24/2019  . 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 History of nonmelanoma skin cancer 05/10/2017  . Vitamin D deficiency 03/29/2014  . Routine general medical examination at a health care facility 06/30/2012  . Insomnia secondary to depression with anxiety 06/30/2012  . Post-menopausal osteoporosis    PHYSICAL THERAPY DISCHARGE SUMMARY  Visits from Start of Care:4  Current functional level related to goals / functional outcomes: Pt reports 90% overall improvement in symptoms since the start of care.  Pt will continue with HEP for flexibility and strength.     Remaining deficits: No significant functional deficits at this time   Education / Equipment: HEP Plan: Patient agrees to discharge.  Patient goals were met. Patient is being discharged due to not returning since the last visit.  ?????         Sigurd Sos, PT 11/06/19 11:05 AM  [place] [address], [place], [place], [address] Phone: [phone]   Fax:  [phone]  Name: Joy Edwards MRN: [id number]  Date of Birth:  1955/12/10

## 2019-11-17 ENCOUNTER — Ambulatory Visit
Admission: RE | Admit: 2019-11-17 | Discharge: 2019-11-17 | Disposition: A | Discharge status: Home / Self Care | Payer: PPO | Source: Ambulatory Visit | Attending: Emergency Medicine | Admitting: Emergency Medicine

## 2019-11-17 ENCOUNTER — Other Ambulatory Visit: Payer: Self-pay

## 2019-11-17 DIAGNOSIS — Z23 Encounter for immunization: Secondary | ICD-10-CM

## 2019-11-17 DIAGNOSIS — S61411A Laceration without foreign body of right hand, initial encounter: Secondary | ICD-10-CM

## 2019-11-17 DIAGNOSIS — S6991XA Unspecified injury of right wrist, hand and finger(s), initial encounter: Secondary | ICD-10-CM | POA: Diagnosis not present

## 2019-11-17 MED ORDER — CEPHALEXIN 500 MG PO CAPS
500.0000 mg | ORAL_CAPSULE | Freq: Four times a day (QID) | ORAL | 0 refills | Status: DC
Start: 2019-11-17 — End: 2019-11-17

## 2019-11-17 MED ORDER — TETANUS-DIPHTH-ACELL PERTUSSIS 5-2.5-18.5 LF-MCG/0.5 IM SUSP
0.5000 mL | Freq: Once | INTRAMUSCULAR | Status: AC
  Administered 2019-11-17: 0.5 mL via INTRAMUSCULAR

## 2019-11-17 MED ORDER — CEPHALEXIN 500 MG PO CAPS: 500.0000 mg | ORAL_CAPSULE | Freq: Two times a day (BID) | ORAL | 0 refills | Status: AC

## 2019-11-17 NOTE — Discharge Instructions (Addendum)
Tetanus updated 5 sutures applied Bandage applied Keep covered for next and dry for next 24-48 hours.  After then you may gently clean with warm water and mild soap.  Avoid submerging wound in water. Change dressing daily and apply a thin layer of neosporin.  Keflex prescribed.  Take as directed and to completion Return in 10-12 days to have sutures removed.   Take OTC ibuprofen or tylenol as needed for pain relief Return sooner or go to the ED if you have any new or worsening symptoms such as increased pain, redness, swelling, drainage, discharge, decreased range of motion of extremity, etc..

## 2019-11-17 NOTE — ED Provider Notes (Signed)
[place]   [id number] 11/17/19 Arrival Time: 1025  CC: LACERATION  SUBJECTIVE:  Joy Edwards is a 64 y.o. female who presents with a laceration that occurred last night.  Symptoms began after cutting RT palm with glassware.  Bleeding controlled.  Currently not on blood thinners.  Denies similar symptoms in the past.  Denies fever, chills, nausea, vomiting, redness, swelling, purulent drainage, decrease strength or sensation.   Td UTD: Unknown.  ROS: As per HPI.  All other pertinent ROS negative.     Past Medical History:  Diagnosis Date  . Cancer ([place])   . Depression   . Osteoporosis    Past Surgical History:  Procedure Laterality Date  . APPENDECTOMY     No Known Allergies No current facility-administered medications on file prior to encounter.   Current Outpatient Medications on File Prior to Encounter  Medication Sig Dispense Refill  . buPROPion (WELLBUTRIN XL) 300 MG 24 hr tablet Take 300 mg by mouth daily.      . clobetasol ointment (TEMOVATE) 0.05 %     . mometasone (ELOCON) 0.1 % cream Apply 1 application topically daily. 45 g 0  . sertraline (ZOLOFT) 50 MG tablet Take 50 mg by mouth daily.     . Vitamin D, Ergocalciferol, (DRISDOL) 1.25 MG (50000 UNIT) CAPS capsule Take 1 capsule (50,000 Units total) by mouth every 7 (seven) days. 12 capsule 0   Social History   Socioeconomic History  . Marital status: Married    Spouse name: Not on file  . Number of children: Not on file  . Years of education: Not on file  . Highest education level: Not on file  Occupational History  . Not on file  Tobacco Use  . Smoking status: Never Smoker  . Smokeless tobacco: Never Used  Substance and Sexual Activity  . Alcohol use: Yes    Alcohol/week: 7.0 standard drinks    Types: 7 Glasses of wine per week    Comment: wine  . Drug use: No  . Sexual activity: Not on file  Other Topics Concern  . Not on file  Social History Narrative  . Not on file    Social Determinants of Health   Financial Resource Strain:   . Difficulty of Paying Living Expenses: Not on file  Food Insecurity:   . Worried About Charity fundraiser in the Last Year: Not on file  . Ran Out of Food in the Last Year: Not on file  Transportation Needs:   . Lack of Transportation (Medical): Not on file  . Lack of Transportation (Non-Medical): Not on file  Physical Activity:   . Days of Exercise per Week: Not on file  . Minutes of Exercise per Session: Not on file  Stress:   . Feeling of Stress : Not on file  Social Connections:   . Frequency of Communication with Friends and Family: Not on file  . Frequency of Social Gatherings with Friends and Family: Not on file  . Attends Religious Services: Not on file  . Active Member of Clubs or Organizations: Not on file  . Attends Archivist Meetings: Not on file  . Marital Status: Not on file  Intimate Partner Violence:   . Fear of Current or Ex-Partner: Not on file  . Emotionally Abused: Not on file  . Physically Abused: Not on file  . Sexually Abused: Not on file   Family History  Problem Relation Age of Onset  . Alcohol abuse  Father   . Alcohol abuse Sister      OBJECTIVE:  There were no vitals filed for this visit.   General appearance: alert; no distress CV: radial pulse 2+ Skin: laceration of RT palm, medial aspect; size: approx 3-4 cm Hand: Strength and sensation intact Psychological: alert and cooperative; normal mood and affect   Procedure: Verbal consent obtained. Patient provided with risks and alternatives to the procedure. Wound copiously irrigated with NS then cleansed with betadine. Anesthetized with 5 mL of lidocaine with epinephrine after LET. Wound carefully explored. No foreign body, tendon injury, or nonviable tissue were noted. Using sterile technique 3 horizontal and 2 interrupted 5-0 Prolene sutures were placed to reapproximate the wound. Patient tolerated procedure well. No  complications. Minimal bleeding. Patient advised to look for and return for any signs of infection such as redness, swelling, discharge, or worsening pain. Return for suture removal in 10-12 days.  ASSESSMENT & PLAN:  1. Laceration of right hand without foreign body, initial encounter   2. Injury of right hand, initial encounter     Meds ordered this encounter  Medications  . DISCONTD: cephALEXin (KEFLEX) 500 MG capsule    Sig: Take 1 capsule (500 mg total) by mouth 4 (four) times daily.    Dispense:  20 capsule    Refill:  0    Order Specific Question:   Supervising Provider    Answer:   Raylene Everts [[id number]]  . cephALEXin (KEFLEX) 500 MG capsule    Sig: Take 1 capsule (500 mg total) by mouth 2 (two) times daily for 10 days.    Dispense:  20 capsule    Refill:  0    Order Specific Question:   Supervising Provider    Answer:   Raylene Everts [[id number]]  . Tdap (BOOSTRIX) injection 0.5 mL   5 sutures applied Bandage applied Keep covered for next and dry for next 24-48 hours.  After then you may gently clean with warm water and mild soap.  Avoid submerging wound in water. Change dressing daily and apply a thin layer of neosporin.  Keflex prescribed.  Take as directed and to completion Return in 10-12 days to have sutures removed.   Take OTC ibuprofen or tylenol as needed for pain relief Return sooner or go to the ED if you have any new or worsening symptoms such as increased pain, redness, swelling, drainage, discharge, decreased range of motion of extremity, etc..     Reviewed expectations re: course of current medical issues. Questions answered. Outlined signs and symptoms indicating need for more acute intervention. Patient verbalized understanding. After Visit Summary given.   Lestine Box, PA-C 11/17/19 1103

## 2019-11-17 NOTE — ED Triage Notes (Signed)
Pt presents with laceration to right hand that occurred last night , bleeding controlled, pt was cut by broken glass

## 2019-11-28 ENCOUNTER — Ambulatory Visit
Admission: EM | Admit: 2019-11-28 | Discharge: 2019-11-28 | Disposition: A | Discharge status: Home / Self Care | Payer: PPO

## 2019-11-28 ENCOUNTER — Other Ambulatory Visit: Payer: Self-pay

## 2019-11-28 NOTE — ED Notes (Signed)
Pt reports some numbness in her 5th digit since her laceration repair.

## 2019-11-28 NOTE — ED Triage Notes (Signed)
Needs sutures removed from RT hand  

## 2020-01-17 DIAGNOSIS — D485 Neoplasm of uncertain behavior of skin: Secondary | ICD-10-CM | POA: Diagnosis not present

## 2020-01-17 DIAGNOSIS — L538 Other specified erythematous conditions: Secondary | ICD-10-CM | POA: Diagnosis not present

## 2020-01-17 DIAGNOSIS — D0462 Carcinoma in situ of skin of left upper limb, including shoulder: Secondary | ICD-10-CM | POA: Diagnosis not present

## 2020-01-17 DIAGNOSIS — L03012 Cellulitis of left finger: Secondary | ICD-10-CM | POA: Diagnosis not present

## 2020-01-17 DIAGNOSIS — L03011 Cellulitis of right finger: Secondary | ICD-10-CM | POA: Diagnosis not present

## 2020-02-07 DIAGNOSIS — D2362 Other benign neoplasm of skin of left upper limb, including shoulder: Secondary | ICD-10-CM | POA: Diagnosis not present

## 2020-02-07 DIAGNOSIS — D0462 Carcinoma in situ of skin of left upper limb, including shoulder: Secondary | ICD-10-CM | POA: Diagnosis not present

## 2020-02-27 ENCOUNTER — Encounter: Payer: Self-pay | Admitting: *Deleted

## 2020-03-26 ENCOUNTER — Other Ambulatory Visit: Payer: Self-pay

## 2020-03-26 ENCOUNTER — Ambulatory Visit (INDEPENDENT_AMBULATORY_CARE_PROVIDER_SITE_OTHER): Payer: PPO | Admitting: Podiatry

## 2020-03-26 ENCOUNTER — Encounter: Payer: Self-pay | Admitting: Podiatry

## 2020-03-26 ENCOUNTER — Ambulatory Visit (INDEPENDENT_AMBULATORY_CARE_PROVIDER_SITE_OTHER): Payer: PPO

## 2020-03-26 DIAGNOSIS — M7671 Peroneal tendinitis, right leg: Secondary | ICD-10-CM

## 2020-03-26 DIAGNOSIS — M67471 Ganglion, right ankle and foot: Secondary | ICD-10-CM | POA: Diagnosis not present

## 2020-03-26 DIAGNOSIS — M778 Other enthesopathies, not elsewhere classified: Secondary | ICD-10-CM

## 2020-03-26 MED ORDER — METHYLPREDNISOLONE 4 MG PO TBPK: ORAL_TABLET | ORAL | 0 refills | Status: DC

## 2020-03-26 MED ORDER — MELOXICAM 15 MG PO TABS: 15.0000 mg | ORAL_TABLET | Freq: Every day | ORAL | 3 refills | Status: DC

## 2020-03-26 MED ORDER — DEXAMETHASONE SODIUM PHOSPHATE 120 MG/30ML IJ SOLN
2.0000 mg | Freq: Once | INTRAMUSCULAR | Status: AC
  Administered 2020-03-26: 2 mg via INTRA_ARTICULAR

## 2020-03-26 NOTE — Progress Notes (Signed)
Subjective:  Patient ID: Joy Edwards, female    DOB: 28-May-1955,  MRN: [id number] HPI Chief Complaint  Patient presents with  . Foot Pain    Dorsal/lateral right - knot, tender x 1 week, notices pain whether on it walking or not.  . New Patient (Initial Visit)    Est pt 2016    65 y.o. female presents with the above complaint.   ROS: Denies fever chills nausea vomiting muscle aches pains calf pain back pain chest pain shortness of breath.  Past Medical History:  Diagnosis Date  . Cancer ([place])   . Depression   . Osteoporosis    Past Surgical History:  Procedure Laterality Date  . APPENDECTOMY      Current Outpatient Medications:  .  meloxicam (MOBIC) 15 MG tablet, Take 1 tablet (15 mg total) by mouth daily., Disp: 30 tablet, Rfl: 3 .  methylPREDNISolone (MEDROL DOSEPAK) 4 MG TBPK tablet, 6 day dose pack - take as directed, Disp: 21 tablet, Rfl: 0 .  buPROPion (WELLBUTRIN XL) 300 MG 24 hr tablet, Take 300 mg by mouth daily.  , Disp: , Rfl:  .  clobetasol ointment (TEMOVATE) 0.05 %, , Disp: , Rfl:  .  clotrimazole-betamethasone (LOTRISONE) cream, SMARTSIG:1 Topical Every Night, Disp: , Rfl:  .  FLUAD QUADRIVALENT 0.5 ML injection, , Disp: , Rfl:  .  mometasone (ELOCON) 0.1 % cream, Apply 1 application topically daily., Disp: 45 g, Rfl: 0 .  sertraline (ZOLOFT) 50 MG tablet, Take 50 mg by mouth daily. , Disp: , Rfl:  .  Vitamin D, Ergocalciferol, (DRISDOL) 1.25 MG (50000 UNIT) CAPS capsule, Take 1 capsule (50,000 Units total) by mouth every 7 (seven) days., Disp: 12 capsule, Rfl: 0  Allergies  Allergen Reactions  . Latex    Review of Systems Objective:  There were no vitals filed for this visit.  General: Well developed, nourished, in no acute distress, alert and oriented x3   Dermatological: Skin is warm, dry and supple bilateral. Nails x 10 are well maintained; remaining integument appears unremarkable at this time. There are no open sores, no preulcerative  lesions, no rash or signs of infection present.  Vascular: Dorsalis Pedis artery and Posterior Tibial artery pedal pulses are 2/4 bilateral with immedate capillary fill time. Pedal hair growth present. No varicosities and no lower extremity edema present bilateral.   Neruologic: Grossly intact via light touch bilateral. Vibratory intact via tuning fork bilateral. Protective threshold with Semmes Wienstein monofilament intact to all pedal sites bilateral. Patellar and Achilles deep tendon reflexes 2+ bilateral. No Babinski or clonus noted bilateral.   Musculoskeletal: No gross boney pedal deformities bilateral. No pain, crepitus, or limitation noted with foot and ankle range of motion bilateral. Muscular strength 5/5 in all groups tested bilateral.  Nonpulsatile mass at the base of the fourth intermetatarsal space dorsally.  Appears to be fluctuant but firm likely ganglion.  The majority of her pain however is located the proximal edge of the styloid process fifth metatarsal of the right foot.  She has pain against abduction and resistance but minimal pain with abduction and eversion.  Gait: Unassisted, Nonantalgic.    Radiographs:  Radiographs taken today do not demonstrate any type of osseous abnormalities between the fourth and fifth metatarsals of the right foot however it does demonstrate soft tissue increase in density around the fifth metatarsal base around the peroneal tendons.  No fractures are identified.  Early osteoarthritic changes at the fourth TMT joint  Assessment & Plan:  Assessment: Ganglion cyst base of the fourth intermetatarsal space.  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  Plan: Injected the area today with 2 mg of dexamethasone local anesthetic after sterile Betadine skin prep.  She tolerated the procedure well.  Start her on a Medrol Dosepak to be followed by meloxicam.  We discussed appropriate shoe gear for her rectus foot  type.  Follow-up with her in 1 month     Nykia Turko T. [place], [place]

## 2020-04-01 ENCOUNTER — Other Ambulatory Visit: Payer: Self-pay | Admitting: Podiatry

## 2020-04-01 DIAGNOSIS — M7671 Peroneal tendinitis, right leg: Secondary | ICD-10-CM

## 2020-04-10 ENCOUNTER — Encounter: Payer: Self-pay | Admitting: Family Medicine

## 2020-04-10 ENCOUNTER — Telehealth (INDEPENDENT_AMBULATORY_CARE_PROVIDER_SITE_OTHER): Payer: PPO | Admitting: Family Medicine

## 2020-04-10 ENCOUNTER — Other Ambulatory Visit (INDEPENDENT_AMBULATORY_CARE_PROVIDER_SITE_OTHER): Payer: PPO

## 2020-04-10 ENCOUNTER — Other Ambulatory Visit: Payer: Self-pay

## 2020-04-10 VITALS — Ht 65.0 in | Wt 120.0 lb

## 2020-04-10 DIAGNOSIS — R0981 Nasal congestion: Secondary | ICD-10-CM

## 2020-04-10 MED ORDER — AMOXICILLIN-POT CLAVULANATE 875-125 MG PO TABS: 1.0000 | ORAL_TABLET | Freq: Two times a day (BID) | ORAL | 0 refills | Status: DC

## 2020-04-10 NOTE — Assessment & Plan Note (Signed)
I suspect the patient has bacterial sinusitis given significant sinus congestion and purulent discharge from her nose.  Also discussed the potential for COVID-19 infection.  We will get a PCR test on her.  I will go ahead and start her on Augmentin given the severity of her symptoms.  She will contact us if she not improving within 2 to 3 days of antibiotic therapy.  She will seek medical attention for chest pain, shortness of breath, and/or fevers of 103 F or higher.  Discussed strict quarantine precautions to last at least until we get the result of her PCR test.

## 2020-04-10 NOTE — Progress Notes (Signed)
Virtual Visit via video Note  This visit type was conducted due to national recommendations for restrictions regarding the COVID-19 pandemic (e.g. social distancing).  This format is felt to be most appropriate for this patient at this time.  All issues noted in this document were discussed and addressed.  No physical exam was performed (except for noted visual exam findings with Video Visits).   I connected with Joy Edwards today at  1:15 PM EST by a video enabled telemedicine application and verified that I am speaking with the correct person using two identifiers. Location patient: home Location provider: work  Persons participating in the virtual visit: patient, provider  I discussed the limitations, risks, security and privacy concerns of performing an evaluation and management service by telephone and the availability of in person appointments. I also discussed with the patient that there may be a patient responsible charge related to this service. The patient expressed understanding and agreed to proceed.  Reason for visit: same day visit  HPI: Sinus issues: Onset of symptoms 5 days ago.  She has progressively been worsening.  She reports a negative home COVID test.  Cough-yes  Congestion-yes   Sinus-yes, significant in her sinuses with blowing green/yellow/bloody mucus out   Chest-no  Post nasal drip-yes  Sore throat-yes  Shortness of breath-no change to chronic dyspnea  Fever-now though did have some chills  Taste disturbance-no  Smell disturbance-no  Covid exposure-no  Covid vaccination-yes  Covid booster-yes  Medications-patient has tried Mucinex, NyQuil, Sudafed, and a Nettie pot    ROS: See pertinent positives and negatives per HPI.  Past Medical History:  Diagnosis Date  . Cancer ([place])   . Depression   . Osteoporosis     Past Surgical History:  Procedure Laterality Date  . APPENDECTOMY      Family History  Problem Relation Age of Onset  . Alcohol  abuse Father   . Alcohol abuse Sister     SOCIAL HX: Non-smoker   Current Outpatient Medications:  .  amoxicillin-clavulanate (AUGMENTIN) 875-125 MG tablet, Take 1 tablet by mouth 2 (two) times daily., Disp: 14 tablet, Rfl: 0 .  buPROPion (WELLBUTRIN XL) 300 MG 24 hr tablet, Take 300 mg by mouth daily., Disp: , Rfl:  .  clobetasol ointment (TEMOVATE) 0.05 %, , Disp: , Rfl:  .  clotrimazole-betamethasone (LOTRISONE) cream, SMARTSIG:1 Topical Every Night, Disp: , Rfl:  .  FLUAD QUADRIVALENT 0.5 ML injection, , Disp: , Rfl:  .  meloxicam (MOBIC) 15 MG tablet, Take 1 tablet (15 mg total) by mouth daily., Disp: 30 tablet, Rfl: 3 .  methylPREDNISolone (MEDROL DOSEPAK) 4 MG TBPK tablet, 6 day dose pack - take as directed, Disp: 21 tablet, Rfl: 0 .  mometasone (ELOCON) 0.1 % cream, Apply 1 application topically daily., Disp: 45 g, Rfl: 0 .  sertraline (ZOLOFT) 50 MG tablet, Take 50 mg by mouth daily. , Disp: , Rfl:  .  Vitamin D, Ergocalciferol, (DRISDOL) 1.25 MG (50000 UNIT) CAPS capsule, Take 1 capsule (50,000 Units total) by mouth every 7 (seven) days., Disp: 12 capsule, Rfl: 0  EXAM:  VITALS per patient if applicable:  GENERAL: alert, oriented, appears well and in no acute distress  HEENT: atraumatic, conjunttiva clear, no obvious abnormalities on inspection of external nose and ears  NECK: normal movements of the head and neck  LUNGS: on inspection no signs of respiratory distress, breathing rate appears normal, no obvious gross SOB, gasping or wheezing  CV: no obvious cyanosis  MS:  moves all visible extremities without noticeable abnormality  PSYCH/NEURO: pleasant and cooperative, no obvious depression or anxiety, speech and thought processing grossly intact  ASSESSMENT AND PLAN:  Discussed the following assessment and plan:  Problem List Items Addressed This Visit    Sinus congestion - Primary    I suspect the patient has bacterial sinusitis given significant sinus  congestion and purulent discharge from her nose.  Also discussed the potential for COVID-19 infection.  We will get a PCR test on her.  I will go ahead and start her on Augmentin given the severity of her symptoms.  She will contact us if she not improving within 2 to 3 days of antibiotic therapy.  She will seek medical attention for chest pain, shortness of breath, and/or fevers of 103 F or higher.  Discussed strict quarantine precautions to last at least until we get the result of her PCR test.      Relevant Medications   amoxicillin-clavulanate (AUGMENTIN) 875-125 MG tablet   Other Relevant Orders   Novel Coronavirus, NAA ([facility])       I discussed the assessment and treatment plan with the patient. The patient was provided an opportunity to ask questions and all were answered. The patient agreed with the plan and demonstrated an understanding of the instructions.   The patient was advised to call back or seek an in-person evaluation if the symptoms worsen or if the condition fails to improve as anticipated.    Tommi Rumps, MD

## 2020-04-11 LAB — NOVEL CORONAVIRUS, NAA: SARS-CoV-2, NAA: NOT DETECTED

## 2020-04-11 LAB — SARS-COV-2, NAA 2 DAY TAT

## 2020-04-17 DIAGNOSIS — Z1231 Encounter for screening mammogram for malignant neoplasm of breast: Secondary | ICD-10-CM | POA: Diagnosis not present

## 2020-04-29 ENCOUNTER — Encounter: Payer: Self-pay | Admitting: Internal Medicine

## 2020-04-29 DIAGNOSIS — R922 Inconclusive mammogram: Secondary | ICD-10-CM | POA: Diagnosis not present

## 2020-04-29 DIAGNOSIS — R928 Other abnormal and inconclusive findings on diagnostic imaging of breast: Secondary | ICD-10-CM | POA: Diagnosis not present

## 2020-05-02 ENCOUNTER — Ambulatory Visit: Payer: PPO | Admitting: Podiatry

## 2020-05-07 ENCOUNTER — Ambulatory Visit: Payer: PPO | Admitting: Podiatry

## 2020-05-10 DIAGNOSIS — D0461 Carcinoma in situ of skin of right upper limb, including shoulder: Secondary | ICD-10-CM | POA: Diagnosis not present

## 2020-05-10 DIAGNOSIS — L821 Other seborrheic keratosis: Secondary | ICD-10-CM | POA: Diagnosis not present

## 2020-05-10 DIAGNOSIS — D2261 Melanocytic nevi of right upper limb, including shoulder: Secondary | ICD-10-CM | POA: Diagnosis not present

## 2020-05-10 DIAGNOSIS — D485 Neoplasm of uncertain behavior of skin: Secondary | ICD-10-CM | POA: Diagnosis not present

## 2020-05-10 DIAGNOSIS — N281 Cyst of kidney, acquired: Secondary | ICD-10-CM | POA: Diagnosis not present

## 2020-05-10 DIAGNOSIS — D2271 Melanocytic nevi of right lower limb, including hip: Secondary | ICD-10-CM | POA: Diagnosis not present

## 2020-05-10 DIAGNOSIS — D2262 Melanocytic nevi of left upper limb, including shoulder: Secondary | ICD-10-CM | POA: Diagnosis not present

## 2020-05-10 DIAGNOSIS — D2272 Melanocytic nevi of left lower limb, including hip: Secondary | ICD-10-CM | POA: Diagnosis not present

## 2020-05-10 DIAGNOSIS — Z85828 Personal history of other malignant neoplasm of skin: Secondary | ICD-10-CM | POA: Diagnosis not present

## 2020-06-07 ENCOUNTER — Telehealth: Payer: Self-pay | Admitting: Internal Medicine

## 2020-06-07 NOTE — Telephone Encounter (Signed)
Called pt husband for appt reminder. Pt requesting for wife to be transferred to Dam[facility] Dunnings which is his provider and more convenient for them to be in the same office. Please Advise

## 2020-06-07 NOTE — Telephone Encounter (Signed)
Are you okay taking patient?

## 2020-06-09 NOTE — Telephone Encounter (Signed)
Okay with me.  Please schedule yearly appointment for later this summer.

## 2020-06-10 NOTE — Telephone Encounter (Signed)
Dr. Damita Dunnings is okay with patient TOC from Dr. Derrel Nip to him. Please call and schedule patient appt.

## 2020-06-12 NOTE — Telephone Encounter (Signed)
[facility] 6/23 @10 

## 2020-06-21 ENCOUNTER — Ambulatory Visit: Payer: PPO

## 2020-07-10 ENCOUNTER — Other Ambulatory Visit: Payer: Self-pay

## 2020-07-10 ENCOUNTER — Ambulatory Visit
Admission: EM | Admit: 2020-07-10 | Discharge: 2020-07-10 | Disposition: A | Discharge status: Home / Self Care | Payer: PPO | Attending: Family Medicine | Admitting: Family Medicine

## 2020-07-10 ENCOUNTER — Encounter: Payer: Self-pay | Admitting: Emergency Medicine

## 2020-07-10 DIAGNOSIS — J0140 Acute pansinusitis, unspecified: Secondary | ICD-10-CM

## 2020-07-10 MED ORDER — GUAIFENESIN-CODEINE 100-10 MG/5ML PO SYRP
5.0000 mL | ORAL_SOLUTION | Freq: Three times a day (TID) | ORAL | 0 refills | Status: DC | PRN
Start: 2020-07-10 — End: 2020-07-25

## 2020-07-10 MED ORDER — AMOXICILLIN-POT CLAVULANATE 875-125 MG PO TABS: 1.0000 | ORAL_TABLET | Freq: Two times a day (BID) | ORAL | 0 refills | Status: AC

## 2020-07-10 NOTE — Discharge Instructions (Signed)
I have sent in Augmentin for you to take twice a day for 7 days.  I have sent in cough syrup for you to take. This medication can make you sleepy. Do not drive while taking this medication.  Follow up with this office or with primary care if symptoms are persisting.  Follow up in the ER for high fever, trouble swallowing, trouble breathing, other concerning symptoms.

## 2020-07-10 NOTE — ED Triage Notes (Signed)
Eye, teeth and head hurts.  Yellow nasal congestion symptoms since Sunday.

## 2020-07-10 NOTE — ED Provider Notes (Signed)
[place]   [id number] 07/10/20 Arrival Time: 4268  TM:HDQQ THROAT  SUBJECTIVE: History from: patient.  SRUTI AYLLON is a 65 y.o. female who presents with abrupt onset of nasal congestion, headache, fatigue for 4 days .  Reports allergy symptoms for 2 weeks prior. Denies sick exposure to Covid, strep, flu or mono, or precipitating event. Has negative history of Covid. Has had Covid vaccines. Has tried tylenol and zyrtec without relief.  There are no aggravating symptoms. Reports previous symptoms in the past.     Denies fever, chills, ear pain, rhinorrhea, cough, SOB, wheezing, chest pain, nausea, rash, changes in bowel or bladder habits.    ROS: As per HPI.  All other pertinent ROS negative.     Past Medical History:  Diagnosis Date   Cancer [facility])    Depression    Osteoporosis    Past Surgical History:  Procedure Laterality Date   APPENDECTOMY     Allergies  Allergen Reactions   Latex    No current facility-administered medications on file prior to encounter.   Current Outpatient Medications on File Prior to Encounter  Medication Sig Dispense Refill   buPROPion (WELLBUTRIN XL) 300 MG 24 hr tablet Take 300 mg by mouth daily.     clobetasol ointment (TEMOVATE) 0.05 %      clotrimazole-betamethasone (LOTRISONE) cream SMARTSIG:1 Topical Every Night     FLUAD QUADRIVALENT 0.5 ML injection      meloxicam (MOBIC) 15 MG tablet Take 1 tablet (15 mg total) by mouth daily. 30 tablet 3   mometasone (ELOCON) 0.1 % cream Apply 1 application topically daily. 45 g 0   sertraline (ZOLOFT) 50 MG tablet Take 50 mg by mouth daily.      Vitamin D, Ergocalciferol, (DRISDOL) 1.25 MG (50000 UNIT) CAPS capsule Take 1 capsule (50,000 Units total) by mouth every 7 (seven) days. 12 capsule 0   Social History   Socioeconomic History   Marital status: Married    Spouse name: Not on file   Number of children: Not on file   Years of education: Not on file   Highest education  level: Not on file  Occupational History   Not on file  Tobacco Use   Smoking status: Never   Smokeless tobacco: Never  Substance and Sexual Activity   Alcohol use: Yes    Alcohol/week: 7.0 standard drinks    Types: 7 Glasses of wine per week    Comment: wine   Drug use: No   Sexual activity: Not on file  Other Topics Concern   Not on file  Social History Narrative   Not on file   Social Determinants of Health   Financial Resource Strain: Not on file  Food Insecurity: Not on file  Transportation Needs: Not on file  Physical Activity: Not on file  Stress: Not on file  Social Connections: Not on file  Intimate Partner Violence: Not on file   Family History  Problem Relation Age of Onset   Alcohol abuse Father    Alcohol abuse Sister     OBJECTIVE:  Vitals:   07/10/20 1023  BP: 129/62  Pulse: 74  Resp: 16  Temp: 97.8 F (36.6 C)  TempSrc: Oral  SpO2: 97%     General appearance: alert; appears fatigued, but nontoxic, speaking in full sentences and managing own secretions HEENT: NCAT; Ears: EACs clear, TMs pearly gray with visible cone of light, without erythema; Eyes: PERRL, EOMI grossly; Nose: no obvious rhinorrhea; Throat: oropharynx  clear, tonsils 1+ and mildly erythematous without white tonsillar exudates, uvula midline; Sinuses: tender to palpation Neck: supple with LAD Lungs: CTA bilaterally without adventitious breath sounds; cough absent Heart: regular rate and rhythm.  Radial pulses 2+ symmetrical bilaterally Skin: warm and dry Psychological: alert and cooperative; normal mood and affect  LABS: No results found for this or any previous visit (from the past 24 hour(s)).   ASSESSMENT & PLAN:  1. Acute non-recurrent pansinusitis     Meds ordered this encounter  Medications   amoxicillin-clavulanate (AUGMENTIN) 875-125 MG tablet    Sig: Take 1 tablet by mouth 2 (two) times daily for 7 days.    Dispense:  14 tablet    Refill:  0    Order Specific  Question:   Supervising Provider    Answer:   Bari Mantis   guaiFENesin-codeine (ROBITUSSIN AC) 100-10 MG/5ML syrup    Sig: Take 5 mLs by mouth 3 (three) times daily as needed for cough.    Dispense:  120 mL    Refill:  0    Order Specific Question:   Supervising Provider    Answer:   Chase Picket [id number]    Acute Sinusitis Push fluids and get rest Prescribed amoxicillin 875mg  twice daily for 7 days.   Prescribed chertussin Sedation precautions given Take as directed and to completion.  Drink warm or cool liquids, use throat lozenges, or popsicles to help alleviate symptoms Take OTC ibuprofen or tylenol as needed for pain May use Zyrtec D and flonase to help alleviate symptoms Follow up with PCP if symptoms persist Return or go to ER if you have any new or worsening symptoms such as fever, chills, nausea, vomiting, worsening sore throat, cough, abdominal pain, chest pain, changes in bowel or bladder habits.   Reviewed expectations re: course of current medical issues. Questions answered. Outlined signs and symptoms indicating need for more acute intervention. Patient verbalized understanding. After Visit Summary given.          Faustino Congress, NP 07/11/20 1016

## 2020-07-17 DIAGNOSIS — D0461 Carcinoma in situ of skin of right upper limb, including shoulder: Secondary | ICD-10-CM | POA: Diagnosis not present

## 2020-07-17 DIAGNOSIS — C44629 Squamous cell carcinoma of skin of left upper limb, including shoulder: Secondary | ICD-10-CM | POA: Diagnosis not present

## 2020-07-25 ENCOUNTER — Encounter: Payer: Self-pay | Admitting: Family Medicine

## 2020-07-25 ENCOUNTER — Other Ambulatory Visit: Payer: Self-pay

## 2020-07-25 ENCOUNTER — Ambulatory Visit (INDEPENDENT_AMBULATORY_CARE_PROVIDER_SITE_OTHER): Payer: PPO | Admitting: Family Medicine

## 2020-07-25 VITALS — BP 106/74 | HR 67 | Temp 97.6°F | Ht 65.0 in | Wt 117.0 lb

## 2020-07-25 DIAGNOSIS — R7989 Other specified abnormal findings of blood chemistry: Secondary | ICD-10-CM

## 2020-07-25 DIAGNOSIS — L301 Dyshidrosis [pompholyx]: Secondary | ICD-10-CM

## 2020-07-25 DIAGNOSIS — Z7189 Other specified counseling: Secondary | ICD-10-CM

## 2020-07-25 DIAGNOSIS — F5105 Insomnia due to other mental disorder: Secondary | ICD-10-CM

## 2020-07-25 DIAGNOSIS — M810 Age-related osteoporosis without current pathological fracture: Secondary | ICD-10-CM | POA: Diagnosis not present

## 2020-07-25 DIAGNOSIS — F418 Other specified anxiety disorders: Secondary | ICD-10-CM

## 2020-07-25 DIAGNOSIS — Z0000 Encounter for general adult medical examination without abnormal findings: Secondary | ICD-10-CM

## 2020-07-25 LAB — COMPREHENSIVE METABOLIC PANEL
ALT: 23 U/L (ref 0–35)
AST: 23 U/L (ref 0–37)
Albumin: 4.5 g/dL (ref 3.5–5.2)
Alkaline Phosphatase: 60 U/L (ref 39–117)
BUN: 20 mg/dL (ref 6–23)
CO2: 28 mEq/L (ref 19–32)
Calcium: 9.7 mg/dL (ref 8.4–10.5)
Chloride: 102 mEq/L (ref 96–112)
Creatinine, Ser: 0.72 mg/dL (ref 0.40–1.20)
GFR: 86.73 mL/min (ref >60.00)
Glucose, Bld: 102 mg/dL — ABNORMAL HIGH (ref 70–99)
Potassium: 4.3 mEq/L (ref 3.5–5.1)
Sodium: 138 mEq/L (ref 135–145)
Total Bilirubin: 0.4 mg/dL (ref 0.2–1.2)
Total Protein: 7 g/dL (ref 6.0–8.3)

## 2020-07-25 LAB — LIPID PANEL
Cholesterol: 196 mg/dL (ref 0–200)
HDL: 71.1 mg/dL (ref >39.00)
LDL Cholesterol: 108 mg/dL — ABNORMAL HIGH (ref 0–99)
NonHDL: 124.43
Total CHOL/HDL Ratio: 3
Triglycerides: 80 mg/dL (ref 0.0–149.0)
VLDL: 16 mg/dL (ref 0.0–40.0)

## 2020-07-25 LAB — VITAMIN D 25 HYDROXY (VIT D DEFICIENCY, FRACTURES): VITD: 95.53 ng/mL (ref 30.00–100.00)

## 2020-07-25 NOTE — Progress Notes (Signed)
This visit occurred during the SARS-CoV-2 public health emergency.  Safety protocols were in place, including screening questions prior to the visit, additional usage of staff PPE, and extensive cleaning of exam room while observing appropriate contact time as indicated for disinfecting solutions.  TOC.   Vaccines d/w pt.  See AVS.  Colonoscopy 2019 Not due for pap DXA due Mammogram 2022 Living will d/w pt.  Daughter Yaira Bernardi designated if patient were incapacitated.   Diet and exercise d/w pt.    She doesn't have HLD, d/w pt.  Hx updated.    Mood d/w pt.  Follow by psych.  Mood is better on meds, no ADE on med.  No SI/HI.    She had dyshidrosis and uses steroid cream at baseline.    Osteoporosis.  DXA 2018.  Repeat DXA pending.  Vit D pending.  H/o dysphagia and I asked her to f/u with Dr. Collene Mares.  She prev took fosamax for about 2 years, but not recently.    Meds, vitals, and allergies reviewed.   ROS: Per HPI.  Unless specifically indicated otherwise in HPI, the patient denies:  General: fever. Eyes: acute vision changes ENT: sore throat Cardiovascular: chest pain Respiratory: SOB GI: vomiting GU: dysuria Musculoskeletal: acute back pain Derm: acute rash Neuro: acute motor dysfunction Psych: worsening mood Endocrine: polydipsia Heme: bleeding Allergy: hayfever  GEN: nad, alert and oriented HEENT: ncat NECK: supple w/o LA CV: rrr.  PULM: ctab, no inc wob ABD: soft, +bs EXT: no edema SKIN: no acute rash  39 minutes were devoted to patient care in this encounter (this includes time spent reviewing the patient's file/history, interviewing and examining the patient, counseling/reviewing plan with patient).

## 2020-07-25 NOTE — Patient Instructions (Addendum)
Please check with [facility] and let me know which PNA vaccine you had and when you had it.  (#13, 20, or 23). I would get a flu shot each fall.   Take care.  Glad to see you. Go to the lab on the way out.   If you have mychart we'll likely use that to update you.    Call Dr. Collene Mares about your swallowing.   We'll call about getting the bone density test set up.

## 2020-07-28 DIAGNOSIS — L301 Dyshidrosis [pompholyx]: Secondary | ICD-10-CM | POA: Insufficient documentation

## 2020-07-28 DIAGNOSIS — Z0000 Encounter for general adult medical examination without abnormal findings: Secondary | ICD-10-CM | POA: Insufficient documentation

## 2020-07-28 DIAGNOSIS — Z7189 Other specified counseling: Secondary | ICD-10-CM | POA: Insufficient documentation

## 2020-07-28 NOTE — Assessment & Plan Note (Signed)
Continue topical steroid cream per her routine.

## 2020-07-28 NOTE — Assessment & Plan Note (Signed)
Follow by psych.  Mood is better on meds, no ADE on med.  No SI/HI.

## 2020-07-28 NOTE — Assessment & Plan Note (Signed)
DXA 2018.  Repeat DXA pending.  Vit D pending.  H/o dysphagia and I asked her to f/u with Dr. Collene Mares.  She prev took fosamax for about 2 years, but not recently.

## 2020-07-28 NOTE — Assessment & Plan Note (Signed)
Vaccines d/w pt.  See AVS.  Colonoscopy 2019 Not due for pap DXA due Mammogram 2022 Living will d/w pt.  Daughter Emylie Amster designated if patient were incapacitated.   Diet and exercise d/w pt.

## 2020-07-28 NOTE — Assessment & Plan Note (Deleted)
  Vaccines d/w pt.  See AVS.  Colonoscopy 2019 Not due for pap DXA due Mammogram 2022 Living will d/w pt.  Daughter Jaclynn Laumann designated if patient were incapacitated.   Diet and exercise d/w pt.

## 2020-07-28 NOTE — Assessment & Plan Note (Signed)
Living will d/w pt.  Daughter Frayda Egley designated if patient were incapacitated.

## 2020-07-28 NOTE — Assessment & Plan Note (Signed)
  Vaccines d/w pt.  See AVS.  Colonoscopy 2019 Not due for pap DXA due Mammogram 2022 Living will d/w pt.  Daughter Juvia Aerts designated if patient were incapacitated.   Diet and exercise d/w pt.

## 2020-07-30 DIAGNOSIS — S60459A Superficial foreign body of unspecified finger, initial encounter: Secondary | ICD-10-CM | POA: Diagnosis not present

## 2020-07-31 DIAGNOSIS — Z20822 Contact with and (suspected) exposure to covid-19: Secondary | ICD-10-CM | POA: Diagnosis not present

## 2020-08-14 ENCOUNTER — Other Ambulatory Visit: Payer: Self-pay

## 2020-08-14 ENCOUNTER — Ambulatory Visit (INDEPENDENT_AMBULATORY_CARE_PROVIDER_SITE_OTHER): Payer: PPO

## 2020-08-14 ENCOUNTER — Encounter: Payer: Self-pay | Admitting: Orthopaedic Surgery

## 2020-08-14 ENCOUNTER — Ambulatory Visit: Payer: PPO | Admitting: Orthopaedic Surgery

## 2020-08-14 ENCOUNTER — Encounter: Payer: Self-pay | Admitting: Emergency Medicine

## 2020-08-14 ENCOUNTER — Ambulatory Visit
Admission: EM | Admit: 2020-08-14 | Discharge: 2020-08-14 | Disposition: A | Discharge status: Home / Self Care | Payer: PPO | Attending: Internal Medicine | Admitting: Internal Medicine

## 2020-08-14 DIAGNOSIS — S52022A Displaced fracture of olecranon process without intraarticular extension of left ulna, initial encounter for closed fracture: Secondary | ICD-10-CM

## 2020-08-14 DIAGNOSIS — M25522 Pain in left elbow: Secondary | ICD-10-CM | POA: Diagnosis not present

## 2020-08-14 DIAGNOSIS — W19XXXA Unspecified fall, initial encounter: Secondary | ICD-10-CM | POA: Diagnosis not present

## 2020-08-14 DIAGNOSIS — S52012A Torus fracture of upper end of left ulna, initial encounter for closed fracture: Secondary | ICD-10-CM | POA: Diagnosis not present

## 2020-08-14 MED ORDER — KETOROLAC TROMETHAMINE 30 MG/ML IJ SOLN
30.0000 mg | Freq: Once | INTRAMUSCULAR | Status: AC
  Administered 2020-08-14: 30 mg via INTRAMUSCULAR

## 2020-08-14 MED ORDER — HYDROCODONE-ACETAMINOPHEN 5-325 MG PO TABS
1.0000 | ORAL_TABLET | Freq: Four times a day (QID) | ORAL | 0 refills | Status: DC | PRN
Start: 2020-08-14 — End: 2020-08-21

## 2020-08-14 NOTE — Discharge Instructions (Addendum)
Please go to [facility] office from here this morning for further evaluation

## 2020-08-14 NOTE — ED Triage Notes (Signed)
Pt slipped and fell this morning.  C/o pain to LT elbow

## 2020-08-14 NOTE — Progress Notes (Signed)
Office Visit Note   Patient: Joy Edwards           Date of Birth: 10/26/1955           MRN: [id number] Visit Date: 08/14/2020              Requested by: Tonia Ghent, MD [address] [place],  [place] [address] PCP: Tonia Ghent, MD   Assessment & Plan: Visit Diagnoses:  1. Closed olecranon fracture, left, initial encounter     Plan: We repeated x-rays to get a better look at the fracture morphology.  Based on findings I have recommended surgical repair to allow for anatomic healing, more reliable fracture healing, early mobilization, pain relief.  Risk benefits rehab recovery reviewed with the patient in detail.  We have placed in a long-arm splint today.  The swelling will need to subside before we can safely make a surgical incision.  Prescription for hydrocodone.  We will plan on surgery later next week.  Follow-Up Instructions: Return for Postop.   Orders:  Orders Placed This Encounter  Procedures   XR Elbow 2 Views Left   Meds ordered this encounter  Medications   HYDROcodone-acetaminophen (NORCO) 5-325 MG tablet    Sig: Take 1 tablet by mouth every 6 (six) hours as needed.    Dispense:  20 tablet    Refill:  0      Procedures: No procedures performed   Clinical Data: No additional findings.   Subjective: Chief Complaint  Patient presents with   Left Elbow - Pain    Joy Edwards is a very pleasant 65 year old female who is the aunt of Benita Stabile who comes in for acute left elbow injury that occurred this morning status post mechanical fall.  She was initially evaluated at the urgent care and x-rays demonstrated comminuted olecranon fracture.  She received a Toradol injection which has greatly improved her pain.  She was placed in a sling and comes in today for further evaluation and treatment.  Denies any numbness and tingling distally.   Review of Systems  Constitutional: Negative.   HENT: Negative.    Eyes: Negative.   Respiratory:  Negative.    Cardiovascular: Negative.   Endocrine: Negative.   Musculoskeletal: Negative.   Neurological: Negative.   Hematological: Negative.   Psychiatric/Behavioral: Negative.    All other systems reviewed and are negative.   Objective: Vital Signs: There were no vitals taken for this visit.  Physical Exam Vitals and nursing note reviewed.  Constitutional:      Appearance: She is well-developed.  HENT:     Head: Normocephalic and atraumatic.  Pulmonary:     Effort: Pulmonary effort is normal.  Abdominal:     Palpations: Abdomen is soft.  Musculoskeletal:     Cervical back: Neck supple.  Skin:    General: Skin is warm.     Capillary Refill: Capillary refill takes less than 2 seconds.  Neurological:     Mental Status: She is alert and oriented to person, place, and time.  Psychiatric:        Behavior: Behavior normal.        Thought Content: Thought content normal.        Judgment: Judgment normal.    Ortho Exam Left elbow shows significant swelling from fracture hematoma.  There is no skin compromise.  No neurovascular compromise distally.  Range of motion of the elbow not tested secondary to pain and guarding. Specialty Comments:  No  specialty comments available.  Imaging: DG Elbow Complete Left  Result Date: 08/14/2020 CLINICAL DATA:  Small status post fall. EXAM: LEFT ELBOW - COMPLETE 3+ VIEW COMPARISON:  None. FINDINGS: Generalized osteopenia. Acute comminuted fracture of the olecranon with 2.3 cm of proximal retraction of the fracture fragment and buckling of the triceps tendon. No other fracture or dislocation. No aggressive osseous lesion. Soft tissue swelling over the lying the olecranon. IMPRESSION: Acute comminuted fracture of the olecranon with 2.3 cm of proximal retraction of the fracture fragment and buckling of the triceps tendon. Electronically Signed   By: Kathreen Devoid   On: 08/14/2020 09:14   XR Elbow 2 Views Left  Result Date: 08/14/2020 Comminuted  olecranon fracture with proximal displacement.  There is no elbow dislocation.    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   History of nonmelanoma skin cancer 05/10/2017   Positional vertigo of left ear 04/20/2016   Vitamin D deficiency 03/29/2014   Routine general medical examination at a health care facility 06/30/2012   Insomnia secondary to depression with anxiety 06/30/2012   Post-menopausal osteoporosis    Past Medical History:  Diagnosis Date   Depression    Osteoporosis    Skin cancer     Family History  Problem Relation Age of Onset   Anemia Mother    Alcohol abuse Father    Alcohol abuse Sister    Colon cancer Neg Hx    Breast cancer Neg Hx     Past Surgical History:  Procedure Laterality Date   APPENDECTOMY     Social History   Occupational History   Not on file  Tobacco Use   Smoking status: Never   Smokeless tobacco: Never  Substance and Sexual Activity   Alcohol use: Not Currently    Comment: wine, 1 a day   Drug use: No   Sexual activity: Not on file

## 2020-08-14 NOTE — ED Provider Notes (Signed)
[facility]    CSN: [id number] Arrival date & time: 08/14/20  0805      History   Chief Complaint Chief Complaint  Patient presents with   Elbow Injury    HPI Joy Edwards is a 65 y.o. female comes to urgent care with left elbow pain that started this morning after she had a mechanical fall.  Patient slipped and fell.  She hit her left elbow.  She denies hitting her head.  No loss of consciousness.  Patient describes severe, 10 out of 10 left elbow pain.  Pain is sharp and throbbing.  It is aggravated by palpation and movement of the left elbow.  She denies any numbness or tingling.  No known relieving factors.  Pain is associated with swelling over the left elbow.   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      Home Medications    Prior to Admission medications   Medication Sig Start Date End Date Taking? Authorizing Provider  buPROPion (WELLBUTRIN XL) 300 MG 24 hr tablet Take 300 mg by mouth daily.    [provider]  clobetasol ointment (TEMOVATE) 0.05 %  01/31/19   [provider]  clotrimazole-betamethasone (LOTRISONE) cream SMARTSIG:1 Topical Every Night 02/07/20   [provider]  sertraline (ZOLOFT) 50 MG tablet Take 50  mg by mouth daily.  06/25/19   [provider]    Family History Family History  Problem Relation Age of Onset   Anemia Mother    Alcohol abuse Father    Alcohol abuse Sister    Colon cancer Neg Hx    Breast cancer Neg Hx     Social History Social History   Tobacco Use   Smoking status: Never   Smokeless tobacco: Never  Substance Use Topics   Alcohol use: Not Currently    Comment: wine, 1 a day   Drug use: No     Allergies   Latex   Review of Systems Review of Systems  Constitutional: Negative.   Gastrointestinal: Negative.   Musculoskeletal:  Positive for arthralgias and joint swelling. Negative for back pain, neck pain and neck stiffness.  Skin: Negative.     Physical Exam Triage Vital Signs ED Triage Vitals  Enc Vitals Group     BP 08/14/20 0822 124/67     Pulse Rate 08/14/20 0822 (!) 58     Resp 08/14/20 0822 16     Temp 08/14/20 0822 (!) 97.5 F (36.4 C)     Temp Source 08/14/20 0822 Tympanic     SpO2 08/14/20 0822 98 %     Weight --      Height --      Head Circumference --  Peak Flow --      Pain Score 08/14/20 0829 10     Pain Loc --      Pain Edu? --      Excl. in [place]? --    No data found.  Updated Vital Signs BP 124/67 (BP Location: Right Arm)   Pulse (!) 58   Temp (!) 97.5 F (36.4 C) (Tympanic)   Resp 16   SpO2 98%   Visual Acuity Right Eye Distance:   Left Eye Distance:   Bilateral Distance:    Right Eye Near:   Left Eye Near:    Bilateral Near:     Physical Exam Vitals and nursing note reviewed.  Constitutional:      General: She is in acute distress.     Appearance: She is not ill-appearing.  Musculoskeletal:     Comments: Bruising with swelling over the left elbow.  Tenderness to palpation.  Range of motion is limited by movement.  Neurological:     Mental Status: She is alert.     UC Treatments / Results  Labs (all labs ordered are listed, but only abnormal results are displayed) Labs Reviewed - No  data to display  EKG   Radiology DG Elbow Complete Left  Result Date: 08/14/2020 CLINICAL DATA:  Small status post fall. EXAM: LEFT ELBOW - COMPLETE 3+ VIEW COMPARISON:  None. FINDINGS: Generalized osteopenia. Acute comminuted fracture of the olecranon with 2.3 cm of proximal retraction of the fracture fragment and buckling of the triceps tendon. No other fracture or dislocation. No aggressive osseous lesion. Soft tissue swelling over the lying the olecranon. IMPRESSION: Acute comminuted fracture of the olecranon with 2.3 cm of proximal retraction of the fracture fragment and buckling of the triceps tendon. Electronically Signed   By: Kathreen Devoid   On: 08/14/2020 09:14    Procedures Procedures (including critical care time)  Medications Ordered in UC Medications  ketorolac (TORADOL) 30 MG/ML injection 30 mg (30 mg Intramuscular Given 08/14/20 0900)    Initial Impression / Assessment and Plan / UC Course  I have reviewed the triage vital signs and the nursing notes.  Pertinent labs & imaging results that were available during my care of the patient were reviewed by me and considered in my medical decision making (see chart for details).     1.  Displaced comminuted left olecranon fracture. Patient is referred to Ortho care for evaluation.  She will likely need ORIF.  The family has been working with Ortho care in the past and they requested to be seen by them. Toradol 30 mg IM x1 dose Left arm sling given  Final Clinical Impressions(s) / UC Diagnoses   Final diagnoses:  Closed olecranon fracture, left, initial encounter     Discharge Instructions      Please go to [facility] [place] office from here this morning for further evaluation   ED Prescriptions   None    PDMP not reviewed this encounter.   Chase Picket, MD 08/14/20 [phone]

## 2020-08-19 ENCOUNTER — Telehealth: Payer: Self-pay

## 2020-08-19 ENCOUNTER — Other Ambulatory Visit: Payer: Self-pay

## 2020-08-19 ENCOUNTER — Encounter (HOSPITAL_BASED_OUTPATIENT_CLINIC_OR_DEPARTMENT_OTHER): Payer: Self-pay | Admitting: Orthopaedic Surgery

## 2020-08-19 NOTE — Telephone Encounter (Signed)
Have her loosen the ace bandage.  It's normal to have the hand swell up with this type of injury and splint.

## 2020-08-19 NOTE — Telephone Encounter (Signed)
Patient aware.

## 2020-08-19 NOTE — Telephone Encounter (Signed)
Patient called. She said that she is scheduled to have elbow surgery with Dr.Xu this Friday. She said that her hand is very swollen. She wants to see if there is something more she should be trying or if the bandage needs to be rewrapped differently? She said that Dr.Xu wanted her swelling to go down before surgery, so she is concerned about it getting cancelled. Please call [phone]. Thanks!

## 2020-08-21 ENCOUNTER — Other Ambulatory Visit: Payer: Self-pay | Admitting: Physician Assistant

## 2020-08-21 ENCOUNTER — Telehealth: Payer: Self-pay | Admitting: Orthopaedic Surgery

## 2020-08-21 MED ORDER — HYDROCODONE-ACETAMINOPHEN 5-325 MG PO TABS: 1.0000 | ORAL_TABLET | Freq: Three times a day (TID) | ORAL | 0 refills | Status: DC | PRN

## 2020-08-21 NOTE — Telephone Encounter (Signed)
Called patient no answer.

## 2020-08-21 NOTE — Telephone Encounter (Signed)
Pt called stating she's in a lot of pain and she's having surgery on 08/23/20 but she would like to now if she can have a refill of her hydrocodone rx or anything called in? Pt would like a CB.   [phone]

## 2020-08-21 NOTE — Telephone Encounter (Signed)
Sent in earlier

## 2020-08-23 ENCOUNTER — Ambulatory Visit (HOSPITAL_BASED_OUTPATIENT_CLINIC_OR_DEPARTMENT_OTHER): Payer: PPO | Admitting: Anesthesiology

## 2020-08-23 ENCOUNTER — Ambulatory Visit (HOSPITAL_COMMUNITY): Payer: PPO

## 2020-08-23 ENCOUNTER — Encounter (HOSPITAL_BASED_OUTPATIENT_CLINIC_OR_DEPARTMENT_OTHER)
Admission: RE | Disposition: A | Discharge status: Home / Self Care | Payer: Self-pay | Source: Home / Self Care | Attending: Orthopaedic Surgery

## 2020-08-23 ENCOUNTER — Other Ambulatory Visit: Payer: Self-pay

## 2020-08-23 ENCOUNTER — Ambulatory Visit (HOSPITAL_BASED_OUTPATIENT_CLINIC_OR_DEPARTMENT_OTHER)
Admission: RE | Admit: 2020-08-23 | Discharge: 2020-08-23 | Disposition: A | Discharge status: Home / Self Care | Payer: PPO | Attending: Orthopaedic Surgery | Admitting: Orthopaedic Surgery

## 2020-08-23 ENCOUNTER — Encounter (HOSPITAL_BASED_OUTPATIENT_CLINIC_OR_DEPARTMENT_OTHER): Payer: Self-pay | Admitting: Orthopaedic Surgery

## 2020-08-23 DIAGNOSIS — S52022A Displaced fracture of olecranon process without intraarticular extension of left ulna, initial encounter for closed fracture: Secondary | ICD-10-CM | POA: Diagnosis not present

## 2020-08-23 DIAGNOSIS — X58XXXA Exposure to other specified factors, initial encounter: Secondary | ICD-10-CM | POA: Insufficient documentation

## 2020-08-23 DIAGNOSIS — Z79899 Other long term (current) drug therapy: Secondary | ICD-10-CM | POA: Diagnosis not present

## 2020-08-23 DIAGNOSIS — Z91040 Latex allergy status: Secondary | ICD-10-CM | POA: Insufficient documentation

## 2020-08-23 DIAGNOSIS — Z419 Encounter for procedure for purposes other than remedying health state, unspecified: Secondary | ICD-10-CM

## 2020-08-23 DIAGNOSIS — F418 Other specified anxiety disorders: Secondary | ICD-10-CM | POA: Diagnosis not present

## 2020-08-23 DIAGNOSIS — E559 Vitamin D deficiency, unspecified: Secondary | ICD-10-CM | POA: Diagnosis not present

## 2020-08-23 DIAGNOSIS — G8918 Other acute postprocedural pain: Secondary | ICD-10-CM | POA: Diagnosis not present

## 2020-08-23 HISTORY — PX: ORIF ELBOW FRACTURE: SHX5031

## 2020-08-23 SURGERY — OPEN REDUCTION INTERNAL FIXATION (ORIF) ELBOW/OLECRANON FRACTURE
Anesthesia: Regional | Site: Elbow | Laterality: Left

## 2020-08-23 MED ORDER — FENTANYL CITRATE (PF) 100 MCG/2ML IJ SOLN
INTRAMUSCULAR | Status: AC
  Filled 2020-08-23: qty 2

## 2020-08-23 MED ORDER — EPHEDRINE SULFATE 50 MG/ML IJ SOLN
INTRAMUSCULAR | Status: DC | PRN
  Administered 2020-08-23: 10 mg via INTRAVENOUS
  Administered 2020-08-23: 5 mg via INTRAVENOUS
  Administered 2020-08-23 (×2): 10 mg via INTRAVENOUS
  Administered 2020-08-23: 5 mg via INTRAVENOUS
  Administered 2020-08-23: 10 mg via INTRAVENOUS

## 2020-08-23 MED ORDER — ZINC SULFATE 220 (50 ZN) MG PO CAPS: 220.0000 mg | ORAL_CAPSULE | Freq: Every day | ORAL | 0 refills | Status: AC

## 2020-08-23 MED ORDER — MIDAZOLAM HCL 2 MG/2ML IJ SOLN
2.0000 mg | Freq: Once | INTRAMUSCULAR | Status: AC
  Administered 2020-08-23: 1 mg via INTRAVENOUS

## 2020-08-23 MED ORDER — ACETAMINOPHEN 500 MG PO TABS
ORAL_TABLET | ORAL | Status: AC
  Filled 2020-08-23: qty 2

## 2020-08-23 MED ORDER — LACTATED RINGERS IV SOLN: INTRAVENOUS | Status: DC

## 2020-08-23 MED ORDER — BUPIVACAINE HCL (PF) 0.25 % IJ SOLN
INTRAMUSCULAR | Status: AC
  Filled 2020-08-23: qty 30

## 2020-08-23 MED ORDER — CEFAZOLIN SODIUM-DEXTROSE 2-4 GM/100ML-% IV SOLN
2.0000 g | INTRAVENOUS | Status: AC
  Administered 2020-08-23: 2 g via INTRAVENOUS

## 2020-08-23 MED ORDER — FENTANYL CITRATE (PF) 100 MCG/2ML IJ SOLN
100.0000 ug | Freq: Once | INTRAMUSCULAR | Status: AC
  Administered 2020-08-23: 50 ug via INTRAVENOUS

## 2020-08-23 MED ORDER — ONDANSETRON HCL 4 MG/2ML IJ SOLN
INTRAMUSCULAR | Status: AC
  Filled 2020-08-23: qty 2

## 2020-08-23 MED ORDER — ROCURONIUM BROMIDE 100 MG/10ML IV SOLN
INTRAVENOUS | Status: DC | PRN
  Administered 2020-08-23: 50 mg via INTRAVENOUS

## 2020-08-23 MED ORDER — KETOROLAC TROMETHAMINE 30 MG/ML IJ SOLN
INTRAMUSCULAR | Status: DC | PRN
  Administered 2020-08-23: 15 mg via INTRAVENOUS

## 2020-08-23 MED ORDER — CALCIUM CARBONATE-VITAMIN D 500-200 MG-UNIT PO TABS: 1.0000 | ORAL_TABLET | Freq: Three times a day (TID) | ORAL | 6 refills | Status: DC

## 2020-08-23 MED ORDER — DEXAMETHASONE SODIUM PHOSPHATE 10 MG/ML IJ SOLN
INTRAMUSCULAR | Status: DC | PRN
  Administered 2020-08-23: 5 mg

## 2020-08-23 MED ORDER — PROPOFOL 10 MG/ML IV BOLUS
INTRAVENOUS | Status: DC | PRN
  Administered 2020-08-23: 100 mg via INTRAVENOUS

## 2020-08-23 MED ORDER — OXYCODONE-ACETAMINOPHEN 5-325 MG PO TABS: 1.0000 | ORAL_TABLET | Freq: Three times a day (TID) | ORAL | 0 refills | Status: DC | PRN

## 2020-08-23 MED ORDER — SUGAMMADEX SODIUM 200 MG/2ML IV SOLN
INTRAVENOUS | Status: DC | PRN
  Administered 2020-08-23: 100 mg via INTRAVENOUS

## 2020-08-23 MED ORDER — MIDAZOLAM HCL 2 MG/2ML IJ SOLN
INTRAMUSCULAR | Status: AC
  Filled 2020-08-23: qty 2

## 2020-08-23 MED ORDER — PROPOFOL 10 MG/ML IV BOLUS
INTRAVENOUS | Status: AC
  Filled 2020-08-23: qty 20

## 2020-08-23 MED ORDER — ONDANSETRON HCL 4 MG/2ML IJ SOLN
INTRAMUSCULAR | Status: DC | PRN
  Administered 2020-08-23: 4 mg via INTRAVENOUS

## 2020-08-23 MED ORDER — DEXAMETHASONE SODIUM PHOSPHATE 10 MG/ML IJ SOLN
INTRAMUSCULAR | Status: AC
  Filled 2020-08-23: qty 1

## 2020-08-23 MED ORDER — FENTANYL CITRATE (PF) 100 MCG/2ML IJ SOLN
INTRAMUSCULAR | Status: DC | PRN
  Administered 2020-08-23 (×2): 50 ug via INTRAVENOUS

## 2020-08-23 MED ORDER — CEFAZOLIN SODIUM-DEXTROSE 2-4 GM/100ML-% IV SOLN
INTRAVENOUS | Status: AC
  Filled 2020-08-23: qty 100

## 2020-08-23 MED ORDER — EPHEDRINE 5 MG/ML INJ
INTRAVENOUS | Status: AC
  Filled 2020-08-23: qty 10

## 2020-08-23 MED ORDER — 0.9 % SODIUM CHLORIDE (POUR BTL) OPTIME
TOPICAL | Status: DC | PRN
Start: 2020-08-23 — End: 2020-08-23
  Administered 2020-08-23: 1000 mL

## 2020-08-23 MED ORDER — LIDOCAINE HCL (PF) 2 % IJ SOLN
INTRAMUSCULAR | Status: AC
  Filled 2020-08-23: qty 5

## 2020-08-23 MED ORDER — FENTANYL CITRATE (PF) 100 MCG/2ML IJ SOLN: 25.0000 ug | INTRAMUSCULAR | Status: DC | PRN

## 2020-08-23 MED ORDER — ACETAMINOPHEN 500 MG PO TABS
1000.0000 mg | ORAL_TABLET | Freq: Once | ORAL | Status: AC
  Administered 2020-08-23: 500 mg via ORAL

## 2020-08-23 MED ORDER — DEXAMETHASONE SODIUM PHOSPHATE 4 MG/ML IJ SOLN
INTRAMUSCULAR | Status: DC | PRN
  Administered 2020-08-23: 10 mg via INTRAVENOUS

## 2020-08-23 MED ORDER — ROPIVACAINE HCL 5 MG/ML IJ SOLN
INTRAMUSCULAR | Status: DC | PRN
  Administered 2020-08-23: 20 mL via PERINEURAL

## 2020-08-23 MED ORDER — PHENYLEPHRINE 40 MCG/ML (10ML) SYRINGE FOR IV PUSH (FOR BLOOD PRESSURE SUPPORT)
PREFILLED_SYRINGE | INTRAVENOUS | Status: AC
  Filled 2020-08-23: qty 10

## 2020-08-23 MED ORDER — PHENYLEPHRINE HCL (PRESSORS) 10 MG/ML IV SOLN
INTRAVENOUS | Status: DC | PRN
  Administered 2020-08-23: 40 ug via INTRAVENOUS
  Administered 2020-08-23: 80 ug via INTRAVENOUS
  Administered 2020-08-23 (×2): 40 ug via INTRAVENOUS
  Administered 2020-08-23: 80 ug via INTRAVENOUS
  Administered 2020-08-23: 120 ug via INTRAVENOUS

## 2020-08-23 SURGICAL SUPPLY — 93 items
ADH SKN CLS APL DERMABOND .7 (GAUZE/BANDAGES/DRESSINGS)
BIT DRILL 2.0 (BIT) ×2
BIT DRILL 2XNS DISP SS SM FRAG (BIT) IMPLANT
BIT DRILL CALIBRATED 2.7 (BIT) ×1 IMPLANT
BIT DRL 2XNS DISP SS SM FRAG (BIT) ×1
BLADE HEX COATED 2.75 (ELECTRODE) ×2 IMPLANT
BLADE SURG 15 STRL LF DISP TIS (BLADE) ×1 IMPLANT
BLADE SURG 15 STRL SS (BLADE) ×4
BNDG CMPR 9X4 STRL LF SNTH (GAUZE/BANDAGES/DRESSINGS) ×1
BNDG COHESIVE 4X5 TAN ST LF (GAUZE/BANDAGES/DRESSINGS) ×1 IMPLANT
BNDG COHESIVE 4X5 TAN STRL (GAUZE/BANDAGES/DRESSINGS) ×2 IMPLANT
BNDG ELASTIC 3X5.8 VLCR STR LF (GAUZE/BANDAGES/DRESSINGS) ×1 IMPLANT
BNDG ELASTIC 4X5.8 VLCR STR LF (GAUZE/BANDAGES/DRESSINGS) ×5 IMPLANT
BNDG ESMARK 4X9 LF (GAUZE/BANDAGES/DRESSINGS) ×2 IMPLANT
CORD BIPOLAR FORCEPS 12FT (ELECTRODE) ×2 IMPLANT
COVER BACK TABLE 60X90IN (DRAPES) ×2 IMPLANT
COVER MAYO STAND STRL (DRAPES) ×1 IMPLANT
CUFF TOURN SGL QUICK 18X3 (MISCELLANEOUS) ×3 IMPLANT
DECANTER SPIKE VIAL GLASS SM (MISCELLANEOUS) IMPLANT
DERMABOND ADVANCED (GAUZE/BANDAGES/DRESSINGS)
DERMABOND ADVANCED .7 DNX12 (GAUZE/BANDAGES/DRESSINGS) ×1 IMPLANT
DRAPE C-ARM 42X72 X-RAY (DRAPES) ×1 IMPLANT
DRAPE EXTREMITY T 121X128X90 (DISPOSABLE) ×2 IMPLANT
DRAPE IMP U-DRAPE 54X76 (DRAPES) ×2 IMPLANT
DRAPE INCISE IOBAN 66X45 STRL (DRAPES) ×2 IMPLANT
DRAPE OEC MINIVIEW 54X84 (DRAPES) ×1 IMPLANT
DRAPE SURG 17X23 STRL (DRAPES) ×2 IMPLANT
DRAPE U-SHAPE 47X51 STRL (DRAPES) ×1 IMPLANT
DRSG PAD ABDOMINAL 8X10 ST (GAUZE/BANDAGES/DRESSINGS) ×3 IMPLANT
DURAPREP 26ML APPLICATOR (WOUND CARE) ×2 IMPLANT
ELECT REM PT RETURN 9FT ADLT (ELECTROSURGICAL) ×2
ELECTRODE REM PT RTRN 9FT ADLT (ELECTROSURGICAL) IMPLANT
GAUZE SPONGE 4X4 12PLY STRL (GAUZE/BANDAGES/DRESSINGS) ×2 IMPLANT
GAUZE XEROFORM 1X8 LF (GAUZE/BANDAGES/DRESSINGS) ×2 IMPLANT
GLOVE SURG LTX SZ7 (GLOVE) ×2 IMPLANT
GLOVE SURG NEOP MICRO LF SZ7.5 (GLOVE) ×2 IMPLANT
GLOVE SURG SYN 7.5  E (GLOVE) ×6
GLOVE SURG SYN 7.5 E (GLOVE) ×3 IMPLANT
GLOVE SURG SYN 7.5 PF PI (GLOVE) ×1 IMPLANT
GLOVE SURG UNDER POLY LF SZ7 (GLOVE) ×6 IMPLANT
GLOVE SURG UNDER POLY LF SZ7.5 (GLOVE) ×3 IMPLANT
GOWN STRL REIN XL XLG (GOWN DISPOSABLE) ×2 IMPLANT
GOWN STRL REUS W/ TWL LRG LVL3 (GOWN DISPOSABLE) ×1 IMPLANT
GOWN STRL REUS W/ TWL XL LVL3 (GOWN DISPOSABLE) ×1 IMPLANT
GOWN STRL REUS W/TWL LRG LVL3 (GOWN DISPOSABLE) ×4
GOWN STRL REUS W/TWL XL LVL3 (GOWN DISPOSABLE) ×2
K-WIRE ACE 1.6X6 (WIRE) ×6
KWIRE ACE 1.6X6 (WIRE) IMPLANT
MANIFOLD NEPTUNE II (INSTRUMENTS) ×2 IMPLANT
NDL HYPO 25X1 1.5 SAFETY (NEEDLE) IMPLANT
NEEDLE HYPO 25X1 1.5 SAFETY (NEEDLE) IMPLANT
NS IRRIG 1000ML POUR BTL (IV SOLUTION) ×2 IMPLANT
PACK BASIN DAY SURGERY FS (CUSTOM PROCEDURE TRAY) ×2 IMPLANT
PAD CAST 4YDX4 CTTN HI CHSV (CAST SUPPLIES) ×1 IMPLANT
PADDING CAST ABS 4INX4YD NS (CAST SUPPLIES)
PADDING CAST ABS COTTON 4X4 ST (CAST SUPPLIES) IMPLANT
PADDING CAST COTTON 4X4 STRL (CAST SUPPLIES) ×2
PENCIL SMOKE EVACUATOR (MISCELLANEOUS) ×2 IMPLANT
PLATE OLECRANON LRG (Plate) ×1 IMPLANT
SCREW CORT LP 3.5X12 (Screw) ×1 IMPLANT
SCREW CORT LP T15 3.5X16 (Screw) ×1 IMPLANT
SCREW CORTICAL 2.7MM  18MM (Screw) ×2 IMPLANT
SCREW CORTICAL 2.7MM 18MM (Screw) IMPLANT
SCREW LOCK CORT STAR 3.5X10 (Screw) ×1 IMPLANT
SCREW LOCK CORT STAR 3.5X12 (Screw) ×1 IMPLANT
SCREW LOCK CORT STAR 3.5X14 (Screw) ×2 IMPLANT
SCREW LP NL T15 3.5X20 (Screw) ×1 IMPLANT
SCREW T15 LP CORT 3.5X56MM NS (Screw) ×1 IMPLANT
SCREW TIS LP 3.5X18 NS (Screw) ×1 IMPLANT
SHEET MEDIUM DRAPE 40X70 STRL (DRAPES) ×4 IMPLANT
SLEEVE SCD COMPRESS KNEE MED (STOCKING) ×2 IMPLANT
SLING ARM FOAM STRAP LRG (SOFTGOODS) ×2 IMPLANT
SLING ARM FOAM STRAP MED (SOFTGOODS) ×1 IMPLANT
SPLINT FIBERGLASS 3X35 (CAST SUPPLIES) ×1 IMPLANT
SPLINT FIBERGLASS 4X30 (CAST SUPPLIES) ×1 IMPLANT
SPONGE T-LAP 18X18 ~~LOC~~+RFID (SPONGE) ×2 IMPLANT
STOCKINETTE IMPERVIOUS LG (DRAPES) ×2 IMPLANT
STRIP CLOSURE SKIN 1/4X4 (GAUZE/BANDAGES/DRESSINGS) IMPLANT
SUCTION FRAZIER HANDLE 10FR (MISCELLANEOUS)
SUCTION TUBE FRAZIER 10FR DISP (MISCELLANEOUS) IMPLANT
SUT ETHILON 3 0 PS 1 (SUTURE) ×4 IMPLANT
SUT MNCRL AB 4-0 PS2 18 (SUTURE) IMPLANT
SUT VIC AB 0 CT1 27 (SUTURE) ×2
SUT VIC AB 0 CT1 27XBRD ANBCTR (SUTURE) ×1 IMPLANT
SUT VIC AB 2-0 CT1 27 (SUTURE) ×4
SUT VIC AB 2-0 CT1 TAPERPNT 27 (SUTURE) ×1 IMPLANT
SYR BULB EAR ULCER 3OZ GRN STR (SYRINGE) ×2 IMPLANT
SYR CONTROL 10ML LL (SYRINGE) IMPLANT
TOWEL GREEN STERILE FF (TOWEL DISPOSABLE) ×4 IMPLANT
TUBE CONNECTING 20X1/4 (TUBING) ×2 IMPLANT
UNDERPAD 30X36 HEAVY ABSORB (UNDERPADS AND DIAPERS) ×2 IMPLANT
WASHER 3.5MM (Orthopedic Implant) ×2 IMPLANT
YANKAUER SUCT BULB TIP NO VENT (SUCTIONS) ×2 IMPLANT

## 2020-08-23 NOTE — Anesthesia Procedure Notes (Signed)
Anesthesia Regional Block: Supraclavicular block   Pre-Anesthetic Checklist: , timeout performed,  Correct Patient, Correct Site, Correct Laterality,  Correct Procedure, Correct Position, site marked,  Risks and benefits discussed,  Surgical consent,  Pre-op evaluation,  At surgeon's request and post-op pain management  Laterality: Left  Prep: Maximum Sterile Barrier Precautions used, chloraprep       Needles:  Injection technique: Single-shot  Needle Type: Echogenic Stimulator Needle     Needle Length: 9cm  Needle Gauge: 22     Additional Needles:   Procedures:,,,, ultrasound used (permanent image in chart),,    Narrative:  Start time: 08/23/2020 10:15 AM End time: 08/23/2020 10:25 AM Injection made incrementally with aspirations every 5 mL.  Performed by: Personally  Anesthesiologist: Freddrick March, MD  Additional Notes: Monitors applied. No increased pain on injection. No increased resistance to injection. Injection made in 5cc increments. Good needle visualization. Patient tolerated procedure well.

## 2020-08-23 NOTE — Progress Notes (Signed)
Assisted Dr. Lanetta Inch with left, ultrasound guided, supraclavicular block. Side rails up, monitors on throughout procedure. See vital signs in flow sheet. Tolerated Procedure well.

## 2020-08-23 NOTE — Discharge Instructions (Addendum)
Postoperative instructions:  Weightbearing instructions: non weight bearing  Dressing instructions: Keep your dressing and/or splint clean and dry at all times.  It will be removed at your first post-operative appointment.  Your stitches and/or staples will be removed at this visit.  Incision instructions:  Do not soak your incision for 3 weeks after surgery.  If the incision gets wet, pat dry and do not scrub the incision.  Pain control:  You have been given a prescription to be taken as directed for post-operative pain control.  In addition, elevate the operative extremity above the heart at all times to prevent swelling and throbbing pain.  Take over-the-counter Colace, '100mg'$  by mouth twice a day while taking narcotic pain medications to help prevent constipation.  Follow up appointments: 1) 14 days for suture removal and wound check. 2) Dr. Erlinda Hong as scheduled.   -------------------------------------------------------------------------------------------------------------  After Surgery Pain Control:  After your surgery, post-surgical discomfort or pain is likely. This discomfort can last several days to a few weeks. At certain times of the day your discomfort may be more intense.  Did you receive a nerve block?  A nerve block can provide pain relief for one hour to two days after your surgery. As long as the nerve block is working, you will experience little or no sensation in the area the surgeon operated on.  As the nerve block wears off, you will begin to experience pain or discomfort. It is very important that you begin taking your prescribed pain medication before the nerve block fully wears off. Treating your pain at the first sign of the block wearing off will ensure your pain is better controlled and more tolerable when full-sensation returns. Do not wait until the pain is intolerable, as the medicine will be less effective. It is better to treat pain in advance than to try and  catch up.  General Anesthesia:  If you did not receive a nerve block during your surgery, you will need to start taking your pain medication shortly after your surgery and should continue to do so as prescribed by your surgeon.  Pain Medication:  Most commonly we prescribe Vicodin and Percocet for post-operative pain. Both of these medications contain a combination of acetaminophen (Tylenol) and a narcotic to help control pain.   It takes between 30 and 45 minutes before pain medication starts to work. It is important to take your medication before your pain level gets too intense.   Nausea is a common side effect of many pain medications. You will want to eat something before taking your pain medicine to help prevent nausea.   If you are taking a prescription pain medication that contains acetaminophen, we recommend that you do not take additional over the counter acetaminophen (Tylenol).  Other pain relieving options:   Using a cold pack to ice the affected area a few times a day (15 to 20 minutes at a time) can help to relieve pain, reduce swelling and bruising.   Elevation of the affected area can also help to reduce pain and swelling.  No tylenol until after 4pm today. No ibuprofen/motrin until after 8:45pm today.   Post Anesthesia Home Care Instructions  Activity: Get plenty of rest for the remainder of the day. A responsible individual must stay with you for 24 hours following the procedure.  For the next 24 hours, DO NOT: -Drive a car -Paediatric nurse -Drink alcoholic beverages -Take any medication unless instructed by your physician -Make any legal decisions or  sign important papers.  Meals: Start with liquid foods such as gelatin or soup. Progress to regular foods as tolerated. Avoid greasy, spicy, heavy foods. If nausea and/or vomiting occur, drink only clear liquids until the nausea and/or vomiting subsides. Call your physician if vomiting continues.  Special  Instructions/Symptoms: Your throat may feel dry or sore from the anesthesia or the breathing tube placed in your throat during surgery. If this causes discomfort, gargle with warm salt water. The discomfort should disappear within 24 hours.  If you had a scopolamine patch placed behind your ear for the management of post- operative nausea and/or vomiting:  1. The medication in the patch is effective for 72 hours, after which it should be removed.  Wrap patch in a tissue and discard in the trash. Wash hands thoroughly with soap and water. 2. You may remove the patch earlier than 72 hours if you experience unpleasant side effects which may include dry mouth, dizziness or visual disturbances. 3. Avoid touching the patch. Wash your hands with soap and water after contact with the patch.    Regional Anesthesia Blocks  1. Numbness or the inability to move the "blocked" extremity may last from 3-48 hours after placement. The length of time depends on the medication injected and your individual response to the medication. If the numbness is not going away after 48 hours, call your surgeon.  2. The extremity that is blocked will need to be protected until the numbness is gone and the  Strength has returned. Because you cannot feel it, you will need to take extra care to avoid injury. Because it may be weak, you may have difficulty moving it or using it. You may not know what position it is in without looking at it while the block is in effect.  3. For blocks in the legs and feet, returning to weight bearing and walking needs to be done carefully. You will need to wait until the numbness is entirely gone and the strength has returned. You should be able to move your leg and foot normally before you try and bear weight or walk. You will need someone to be with you when you first try to ensure you do not fall and possibly risk injury.  4. Bruising and tenderness at the needle site are common side effects and  will resolve in a few days.  5. Persistent numbness or new problems with movement should be communicated to the surgeon or the [facility] [phone] [place] [phone]).

## 2020-08-23 NOTE — H&P (Signed)
PREOPERATIVE H&P  Chief Complaint: LEFT OLECRANON FRACTURE  HPI: Joy Edwards is a 65 y.o. female who presents for surgical treatment of LEFT OLECRANON FRACTURE.  She denies any changes in medical history.  Past Medical History:  Diagnosis Date   Depression    Osteoporosis    Skin cancer    Past Surgical History:  Procedure Laterality Date   APPENDECTOMY     Social History   Socioeconomic History   Marital status: Married    Spouse name: Not on file   Number of children: Not on file   Years of education: Not on file   Highest education level: Not on file  Occupational History   Not on file  Tobacco Use   Smoking status: Never   Smokeless tobacco: Never  Vaping Use   Vaping Use: Never used  Substance and Sexual Activity   Alcohol use: Not Currently    Comment: wine, 1 a day   Drug use: No   Sexual activity: Not on file  Other Topics Concern   Not on file  Social History Narrative   Married 1977   Has chickens, goats, horses and 100 acres.     2 daughters   2 grandkids.     Cares for her elderly mother.     Social Determinants of Health   Financial Resource Strain: Not on file  Food Insecurity: Not on file  Transportation Needs: Not on file  Physical Activity: Not on file  Stress: Not on file  Social Connections: Not on file   Family History  Problem Relation Age of Onset   Anemia Mother    Alcohol abuse Father    Alcohol abuse Sister    Colon cancer Neg Hx    Breast cancer Neg Hx    Allergies  Allergen Reactions   Latex    Prior to Admission medications   Medication Sig Start Date End Date Taking? Authorizing Provider  buPROPion (WELLBUTRIN XL) 300 MG 24 hr tablet Take 300 mg by mouth daily.   Yes [provider]  clobetasol ointment (TEMOVATE) 0.05 %  01/31/19  Yes [provider]  clotrimazole-betamethasone (LOTRISONE) cream SMARTSIG:1 Topical Every Night 02/07/20  Yes [provider]   HYDROcodone-acetaminophen (NORCO) 5-325 MG tablet Take 1-2 tablets by mouth 3 (three) times daily as needed. 08/21/20  Yes Aundra Dubin, PA-C  sertraline (ZOLOFT) 50 MG tablet Take 50 mg by mouth daily.  06/25/19  Yes [provider]     Positive ROS: All other systems have been reviewed and were otherwise negative with the exception of those mentioned in the HPI and as above.  Physical Exam: General: Alert, no acute distress Cardiovascular: No pedal edema Respiratory: No cyanosis, no use of accessory musculature GI: abdomen soft Skin: No lesions in the area of chief complaint Neurologic: Sensation intact distally Psychiatric: Patient is competent for consent with normal mood and affect Lymphatic: no lymphedema  MUSCULOSKELETAL: exam stable  Assessment: LEFT OLECRANON FRACTURE  Plan: Plan for Procedure(s): OPEN REDUCTION INTERNAL FIXATION (ORIF) LEFT ELBOW/OLECRANON FRACTURE  The risks benefits and alternatives were discussed with the patient including but not limited to the risks of nonoperative treatment, versus surgical intervention including infection, bleeding, nerve injury,  blood clots, cardiopulmonary complications, morbidity, mortality, among others, and they were willing to proceed.   Preoperative templating of the joint replacement has been completed, documented, and submitted to the Operating Room personnel in order to optimize intra-operative equipment management.   Legrand Como  Erlinda Hong, MD 08/23/2020 9:52 AM

## 2020-08-23 NOTE — Op Note (Signed)
   Date of Surgery: 08/23/2020  INDICATIONS: Ms. Mince is a 65 y.o.-year-old female with a left olecranon fracture.  The Patient did consent to the procedure after discussion of the risks and benefits.  PREOPERATIVE DIAGNOSIS: Left comminuted olecranon fracture  POSTOPERATIVE DIAGNOSIS: Same.  PROCEDURE: Open reduction internal fixation of left olecranon fracture  SURGEON: N. Eduard Roux, M.D.  ASSIST: Ciro Backer [place], [place]; necessary for the timely completion of procedure and due to complexity of procedure.  ANESTHESIA:  general, regional  IV FLUIDS AND URINE: See anesthesia.  ESTIMATED BLOOD LOSS: minimal mL.  IMPLANTS: Biomet olecranon plate  DRAINS: none  COMPLICATIONS: see description of procedure.  DESCRIPTION OF PROCEDURE: The patient was brought to the operating room.  The patient had been signed prior to the procedure and this was documented. The patient had the anesthesia placed by the anesthesiologist.  A time-out was performed to confirm that this was the correct patient, site, side and location. The patient did receive antibiotics prior to the incision and was re-dosed during the procedure as needed at indicated intervals.  A tourniquet was placed.  The patient had the operative extremity prepped and draped in the standard surgical fashion.    Bony landmarks were palpated and a longitudinal incision was made over the posterior aspect of the elbow.  Full-thickness flaps were raised.  I was very careful with handling of the skin and the soft tissues.  The soft tissue was elevated off of the proximal ulna and olecranon.  There was significant comminution to the proximal fragment with a small piece of the olecranon that was still attached to the triceps.  There was also a longitudinal split fracture that created a butterfly fragment.  I first reduced the butterfly fragment back to the ulnar shaft and a interfragmentary screw was placed with excellent purchase.  I then  reduced the proximal fragment that was attached to the triceps back to the rest of the ulna and this was held in place with a K wire that was advanced down the shaft.  Reduction was confirmed under fluoroscopy.  I then contoured an olecranon plate onto the bone and this was checked under fluoroscopy.  I compressed the fracture by placing 2 nonlocking screws through the oblong holes in a eccentric way.  Each screw had excellent purchase.  I then placed a few locking screws through the locking holes of the plate distally.  I then placed a unicortical locking screw through the most proximal portion of the plate using fluoroscopic guidance.  I was then able to place a nonlocking homerun screw through the second most proximal hole of the olecranon plate using fluoroscopic guidance.  This gave excellent purchase.  Final x-rays were taken and the elbow was taken through range of motion.  The surgical site was then thoroughly irrigated I used a #2 max braid suture in a Krakw fashion to reinforce the triceps tendon back to the olecranon.  The suture was tied through the holes of the plate.  Layered closure was then performed.  Sterile dressings were applied.  The elbow was placed in about 65 degrees of flexion in a long-arm splint.  Patient tolerated procedure well had no many complications.  Tawanna Cooler was necessary for opening, closing, retracting, limb positioning and overall facilitation and timely completion of the procedure.  POSTOPERATIVE PLAN: Patient will discharge home and follow-up in 2 weeks for suture removal and repeat imaging.  Azucena Cecil, MD 12:48 PM

## 2020-08-23 NOTE — Anesthesia Preprocedure Evaluation (Addendum)
Anesthesia Evaluation  Patient identified by MRN, date of birth, ID band Patient awake    Reviewed: Allergy & Precautions, NPO status , Patient's Chart, lab work & pertinent test results  Airway Mallampati: II  TM Distance: >3 FB Neck ROM: Full    Dental no notable dental hx. (+) Teeth Intact, Dental Advisory Given   Pulmonary neg pulmonary ROS,    Pulmonary exam normal breath sounds clear to auscultation       Cardiovascular negative cardio ROS Normal cardiovascular exam Rhythm:Regular Rate:Normal     Neuro/Psych PSYCHIATRIC DISORDERS Anxiety Depression negative neurological ROS     GI/Hepatic negative GI ROS, Neg liver ROS,   Endo/Other  negative endocrine ROS  Renal/GU negative Renal ROS  negative genitourinary   Musculoskeletal negative musculoskeletal ROS (+)   Abdominal   Peds  Hematology negative hematology ROS (+)   Anesthesia Other Findings   Reproductive/Obstetrics                            Anesthesia Physical Anesthesia Plan  ASA: 2  Anesthesia Plan: General and Regional   Post-op Pain Management:  Regional for Post-op pain   Induction: Intravenous  PONV Risk Score and Plan: 3 and Midazolam, Dexamethasone and Ondansetron  Airway Management Planned: Oral ETT  Additional Equipment:   Intra-op Plan:   Post-operative Plan: Extubation in OR  Informed Consent: I have reviewed the patients History and Physical, chart, labs and discussed the procedure including the risks, benefits and alternatives for the proposed anesthesia with the patient or authorized representative who has indicated his/her understanding and acceptance.     Dental advisory given  Plan Discussed with: CRNA  Anesthesia Plan Comments:         Anesthesia Quick Evaluation

## 2020-08-23 NOTE — Anesthesia Procedure Notes (Signed)
Procedure Name: Intubation Date/Time: 08/23/2020 11:16 AM Performed by: Ezequiel Kayser, CRNA Pre-anesthesia Checklist: Patient identified, Emergency Drugs available, Suction available and Patient being monitored Patient Re-evaluated:Patient Re-evaluated prior to induction Oxygen Delivery Method: Circle System Utilized Preoxygenation: Pre-oxygenation with 100% oxygen Induction Type: IV induction Ventilation: Mask ventilation without difficulty Laryngoscope Size: Glidescope and 3 Grade View: Grade I Tube type: Oral Tube size: 7.0 mm Number of attempts: 2 Airway Equipment and Method: Stylet and Oral airway Placement Confirmation: ETT inserted through vocal cords under direct vision, positive ETCO2 and breath sounds checked- equal and bilateral Secured at: 21 cm Tube secured with: Tape Dental Injury: Teeth and Oropharynx as per pre-operative assessment  Comments: Dlx1 with Mac 3, Grade 4 view. Pt mask ventilated. Glidescope 3 grade 1 view. OGT inserted after intubation to decompress stomach.

## 2020-08-23 NOTE — Transfer of Care (Signed)
Immediate Anesthesia Transfer of Care Note  Patient: Joy Edwards  Procedure(s) Performed: OPEN REDUCTION INTERNAL FIXATION (ORIF) LEFT ELBOW/OLECRANON FRACTURE (Left: Elbow)  Patient Location: PACU  Anesthesia Type:General and Regional  Level of Consciousness: drowsy  Airway & Oxygen Therapy: Patient Spontanous Breathing and Patient connected to face mask oxygen  Post-op Assessment: Report given to RN and Post -op Vital signs reviewed and stable  Post vital signs: Reviewed and stable  Last Vitals:  Vitals Value Taken Time  BP 129/65 08/23/20 1321  Temp    Pulse 77 08/23/20 1322  Resp 12 08/23/20 1322  SpO2 100 % 08/23/20 1322  Vitals shown include unvalidated device data.  Last Pain:  Vitals:   08/23/20 0925  TempSrc: Oral  PainSc: 5       Patients Stated Pain Goal: 5 ([id number])  Complications: No notable events documented.

## 2020-08-25 NOTE — Anesthesia Postprocedure Evaluation (Signed)
Anesthesia Post Note  Patient: NOLYN EILERT  Procedure(s) Performed: OPEN REDUCTION INTERNAL FIXATION (ORIF) LEFT ELBOW/OLECRANON FRACTURE (Left: Elbow)     Patient location during evaluation: PACU Anesthesia Type: Regional and MAC Level of consciousness: awake and alert Pain management: pain level controlled Vital Signs Assessment: post-procedure vital signs reviewed and stable Respiratory status: spontaneous breathing, nonlabored ventilation, respiratory function stable and patient connected to nasal cannula oxygen Cardiovascular status: stable and blood pressure returned to baseline Postop Assessment: no apparent nausea or vomiting Anesthetic complications: no   No notable events documented.  Last Vitals:  Vitals:   08/23/20 1430 08/23/20 1444  BP: 128/68 127/66  Pulse: 83 88  Resp: 16 16  Temp:  36.6 C  SpO2: 96% 96%    Last Pain:  Vitals:   08/23/20 1444  TempSrc:   PainSc: 0-No pain                 Atisha Hamidi L Lizza Huffaker

## 2020-08-26 ENCOUNTER — Encounter (HOSPITAL_BASED_OUTPATIENT_CLINIC_OR_DEPARTMENT_OTHER): Payer: Self-pay | Admitting: Orthopaedic Surgery

## 2020-08-26 NOTE — Addendum Note (Signed)
Addendum  created 08/26/20 1126 by Jazmine Heckman, Ernesta Amble, CRNA   Charge Capture section accepted

## 2020-08-27 ENCOUNTER — Telehealth: Payer: Self-pay

## 2020-08-27 ENCOUNTER — Other Ambulatory Visit: Payer: Self-pay | Admitting: Physician Assistant

## 2020-08-27 MED ORDER — OXYCODONE-ACETAMINOPHEN 5-325 MG PO TABS: 1.0000 | ORAL_TABLET | Freq: Three times a day (TID) | ORAL | 0 refills | Status: DC | PRN

## 2020-08-27 NOTE — Telephone Encounter (Signed)
Sent in

## 2020-08-27 NOTE — Telephone Encounter (Signed)
Pt called and would like a refill on oxycodone

## 2020-08-30 ENCOUNTER — Encounter: Payer: PPO | Admitting: Orthopaedic Surgery

## 2020-09-06 ENCOUNTER — Ambulatory Visit (INDEPENDENT_AMBULATORY_CARE_PROVIDER_SITE_OTHER): Payer: PPO | Admitting: Orthopaedic Surgery

## 2020-09-06 ENCOUNTER — Other Ambulatory Visit: Payer: Self-pay

## 2020-09-06 ENCOUNTER — Encounter: Payer: Self-pay | Admitting: Orthopaedic Surgery

## 2020-09-06 ENCOUNTER — Ambulatory Visit (INDEPENDENT_AMBULATORY_CARE_PROVIDER_SITE_OTHER): Payer: PPO

## 2020-09-06 VITALS — Ht 62.0 in | Wt 117.0 lb

## 2020-09-06 DIAGNOSIS — S52022A Displaced fracture of olecranon process without intraarticular extension of left ulna, initial encounter for closed fracture: Secondary | ICD-10-CM

## 2020-09-06 NOTE — Progress Notes (Signed)
   Post-Op Visit Note   Patient: Joy Edwards           Date of Birth: 12/23/1955           MRN: [id number] Visit Date: 09/06/2020 PCP: Tonia Ghent, MD   Assessment & Plan:  Chief Complaint:  Chief Complaint  Patient presents with   Left Elbow - Follow-up    Left elbow ORIF   Visit Diagnoses:  1. Closed olecranon fracture, left, initial encounter     Plan: Joy Edwards is 2-week status post ORIF left olecranon fracture.  Overall doing well has no real complaints.  Stopped taking pain medications about a week ago.  Her left elbow and arm demonstrate moderate swelling and bruising.  No neurovascular compromise.  Gentle range of motion of the elbow is well-tolerated.  X-rays show stable fixation and alignment of the fracture.  Sutures removed Steri-Strips applied.  She should continue to wear the sling in public.  May perform gentle range of motion as tolerated 3-4 times a day at home.  Continue with vitamin supplements.  Recheck in 4 weeks with two-view x-rays of the left elbow.  Follow-Up Instructions: Return in about 4 weeks (around 10/04/2020).   Orders:  Orders Placed This Encounter  Procedures   XR Elbow 2 Views Left   No orders of the defined types were placed in this encounter.   Imaging: XR Elbow 2 Views Left  Result Date: 09/06/2020 Stable fixation alignment of olecranon fracture.  No hardware complications.   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   History of nonmelanoma skin cancer 05/10/2017   Positional vertigo of left ear 04/20/2016   Vitamin D deficiency 03/29/2014   Routine general medical examination at a health care facility 06/30/2012   Insomnia secondary to depression  with anxiety 06/30/2012   Post-menopausal osteoporosis    Past Medical History:  Diagnosis Date   Depression    Osteoporosis    Skin cancer     Family History  Problem Relation Age of Onset   Anemia Mother    Alcohol abuse Father    Alcohol abuse Sister    Colon cancer Neg Hx    Breast cancer Neg Hx     Past Surgical History:  Procedure Laterality Date   APPENDECTOMY     ORIF ELBOW FRACTURE Left 08/23/2020   Procedure: OPEN REDUCTION INTERNAL FIXATION (ORIF) LEFT ELBOW/OLECRANON FRACTURE;  Surgeon: Leandrew Koyanagi, MD;  Location: [facility];  Service: Orthopedics;  Laterality: Left;   Social History   Occupational History   Not on file  Tobacco Use   Smoking status: Never   Smokeless tobacco: Never  Vaping Use   Vaping Use: Never used  Substance and Sexual Activity   Alcohol use: Not Currently    Comment: wine, 1 a day   Drug use: No   Sexual activity: Not on file

## 2020-09-25 ENCOUNTER — Encounter: Payer: Self-pay | Admitting: Orthopaedic Surgery

## 2020-09-25 ENCOUNTER — Ambulatory Visit (INDEPENDENT_AMBULATORY_CARE_PROVIDER_SITE_OTHER): Payer: PPO

## 2020-09-25 ENCOUNTER — Ambulatory Visit: Payer: PPO | Admitting: Orthopaedic Surgery

## 2020-09-25 ENCOUNTER — Other Ambulatory Visit: Payer: Self-pay

## 2020-09-25 DIAGNOSIS — S42402D Unspecified fracture of lower end of left humerus, subsequent encounter for fracture with routine healing: Secondary | ICD-10-CM | POA: Diagnosis not present

## 2020-09-25 NOTE — Progress Notes (Signed)
   Post-Op Visit Note   Patient: Joy Edwards           Date of Birth: [birth year]           MRN: [id number] Visit Date: 09/25/2020 PCP: Tonia Ghent, MD   Assessment & Plan:  Chief Complaint:  Chief Complaint  Patient presents with   Left Elbow - Post-op Follow-up   Visit Diagnoses:  1. Closed fracture of left elbow with routine healing, subsequent encounter     Plan: [name] comes in today for concern of painful and hardware complication.  Denies any injuries.  Examination of the left elbow shows a fully healed surgical scar.  She has moderate residual swelling.  She does have palpable hardware underneath the skin.  Reassurance was provided that the screws are not backing out but it is very common to feel the hardware in this area.  Hopefully this will not be a chronic problem that we will need hardware removal in the future.  She will follow-up with [place] as scheduled.  Follow-Up Instructions: Return for As scheduled.   Orders:  Orders Placed This Encounter  Procedures   XR Elbow Complete Left (3+View)   No orders of the defined types were placed in this encounter.   Imaging: XR Elbow Complete Left (3+View)  Result Date: 09/25/2020 Fractures continue to demonstrate healing.  Hardware intact without any complications.   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   History of nonmelanoma skin cancer 05/10/2017   Positional vertigo of left ear 04/20/2016   Vitamin D deficiency 03/29/2014   Routine general medical examination at a health care facility 06/30/2012   Insomnia secondary to depression with anxiety 06/30/2012   Post-menopausal osteoporosis    Past Medical History:  Diagnosis  Date   Depression    Osteoporosis    Skin cancer     Family History  Problem Relation Age of Onset   Anemia Mother    Alcohol abuse Father    Alcohol abuse Sister    Colon cancer Neg Hx    Breast cancer Neg Hx     Past Surgical History:  Procedure Laterality Date   APPENDECTOMY     ORIF ELBOW FRACTURE Left 08/23/2020   Procedure: OPEN REDUCTION INTERNAL FIXATION (ORIF) LEFT ELBOW/OLECRANON FRACTURE;  Surgeon: Leandrew Koyanagi, MD;  Location: [facility];  Service: Orthopedics;  Laterality: Left;   Social History   Occupational History   Not on file  Tobacco Use   Smoking status: Never   Smokeless tobacco: Never  Vaping Use   Vaping Use: Never used  Substance and Sexual Activity   Alcohol use: Not Currently    Comment: wine, 1 a day   Drug use: No   Sexual activity: Not on file

## 2020-10-08 ENCOUNTER — Ambulatory Visit (INDEPENDENT_AMBULATORY_CARE_PROVIDER_SITE_OTHER): Payer: PPO

## 2020-10-08 ENCOUNTER — Encounter: Payer: Self-pay | Admitting: Orthopaedic Surgery

## 2020-10-08 ENCOUNTER — Ambulatory Visit (INDEPENDENT_AMBULATORY_CARE_PROVIDER_SITE_OTHER): Payer: PPO | Admitting: Orthopaedic Surgery

## 2020-10-08 VITALS — Ht 62.0 in | Wt 117.0 lb

## 2020-10-08 DIAGNOSIS — S42402D Unspecified fracture of lower end of left humerus, subsequent encounter for fracture with routine healing: Secondary | ICD-10-CM

## 2020-10-08 NOTE — Progress Notes (Signed)
Post-Op Visit Note   Patient: Joy Edwards           Date of Birth: 1955-03-12           MRN: [id number] Visit Date: 10/08/2020 PCP: Tonia Ghent, MD   Assessment & Plan:  Chief Complaint:  Chief Complaint  Patient presents with   Left Elbow - Follow-up    08/23/2020 ORIF left elbow fx   Visit Diagnoses:  1. Closed fracture of left elbow with routine healing, subsequent encounter     Plan: Patient is a pleasant 65 year old female who comes in today little over 6 weeks out ORIF left elbow fracture, date of surgery 08/23/2020.  She has been doing well.  She has been working on home exercise program.  Still having trouble with gripping and fixing her hair.  She gets some discomfort at night but is not requiring any medication for pain.  Examination of the left elbow shows range of motion from about 30 to 90 degrees.  She has near full supination pronation.  She does have mild to moderate tenderness to the fracture site.  She is neurovascular intact distally.  At this point, we will start her in formal physical therapy to work on range of motion and strengthening.  Follow-up with [place] in 6 weeks time for repeat evaluation and x-rays of the left elbow.  Call with concerns or questions in the meantime.  Follow-Up Instructions: Return in about 6 weeks (around 11/19/2020).   Orders:  Orders Placed This Encounter  Procedures   XR Elbow 2 Views Left   Ambulatory referral to Physical Therapy   No orders of the defined types were placed in this encounter.   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   History of nonmelanoma skin cancer 05/10/2017   Positional vertigo of left ear 04/20/2016   Vitamin D deficiency 03/29/2014   Routine general medical examination at a health care facility 06/30/2012   Insomnia secondary to depression with anxiety 06/30/2012   Post-menopausal osteoporosis    Past Medical History:  Diagnosis Date   Depression    Osteoporosis    Skin cancer     Family History  Problem Relation Age of Onset   Anemia Mother    Alcohol abuse Father    Alcohol abuse Sister    Colon cancer Neg Hx    Breast cancer Neg Hx     Past Surgical History:  Procedure Laterality Date   APPENDECTOMY     ORIF ELBOW FRACTURE Left 08/23/2020   Procedure: OPEN REDUCTION INTERNAL FIXATION (ORIF) LEFT ELBOW/OLECRANON FRACTURE;  Surgeon: Leandrew Koyanagi, MD;  Location: [facility];  Service: Orthopedics;  Laterality: Left;   Social History   Occupational History   Not on file  Tobacco Use   Smoking status: Never   Smokeless tobacco: Never  Vaping Use   Vaping Use: Never used  Substance and Sexual Activity   Alcohol use: Not Currently    Comment: wine, 1 a day   Drug use: No   Sexual activity: Not on file

## 2020-10-29 ENCOUNTER — Ambulatory Visit: Payer: PPO | Admitting: Physical Therapy

## 2020-10-29 ENCOUNTER — Ambulatory Visit: Payer: Self-pay

## 2020-10-29 ENCOUNTER — Ambulatory Visit: Payer: PPO | Admitting: Orthopaedic Surgery

## 2020-10-29 ENCOUNTER — Encounter: Payer: Self-pay | Admitting: Physical Therapy

## 2020-10-29 ENCOUNTER — Other Ambulatory Visit: Payer: Self-pay

## 2020-10-29 ENCOUNTER — Encounter: Payer: Self-pay | Admitting: Orthopaedic Surgery

## 2020-10-29 DIAGNOSIS — M25512 Pain in left shoulder: Secondary | ICD-10-CM

## 2020-10-29 DIAGNOSIS — G8929 Other chronic pain: Secondary | ICD-10-CM

## 2020-10-29 DIAGNOSIS — M6281 Muscle weakness (generalized): Secondary | ICD-10-CM

## 2020-10-29 DIAGNOSIS — M25622 Stiffness of left elbow, not elsewhere classified: Secondary | ICD-10-CM

## 2020-10-29 DIAGNOSIS — M25522 Pain in left elbow: Secondary | ICD-10-CM | POA: Diagnosis not present

## 2020-10-29 MED ORDER — DICLOFENAC SODIUM 75 MG PO TBEC: 75.0000 mg | DELAYED_RELEASE_TABLET | Freq: Two times a day (BID) | ORAL | 2 refills | Status: DC

## 2020-10-29 NOTE — Patient Instructions (Signed)
Access Code: [email or web address] URL: [email or web address] Date: 10/29/2020 Prepared by: Elsie Ra  Exercises Seated Wrist Supination Pronation with Can - 2 x daily - 6 x weekly - 2-3 sets - 10 reps Wrist Flexion with Dumbbell - 2 x daily - 6 x weekly - 2-3 sets - 10 reps Wrist Extension with Dumbbell - 2 x daily - 6 x weekly - 2-3 sets - 10 reps Supported Elbow Flexion Extension PROM - 2 x daily - 6 x weekly - 3 sets - 10 reps - 20 sec hold Seated Single Arm Bicep Curls with Rotation and Dumbbell - 2 x daily - 6 x weekly - 1-2 sets - 10 reps - 10 seconds at bottom hold Seated Shoulder External Rotation PROM on Table - 2-3 x daily - 6 x weekly - 1-2 sets - 10 reps - 5-10 sec hold Seated Shoulder Flexion Towel Slide at Table Top Full Range of Motion - 2 x daily - 6 x weekly - 1-2 sets - 10 reps - 5-10 sec hold Seated Shoulder Abduction Towel Slide at Table Top with Forearm in Neutral - 4-5 x daily - 6 x weekly - 1-2 sets - 10 reps - 5-10 sec hold

## 2020-10-29 NOTE — Progress Notes (Signed)
Office Visit Note   Patient: Joy Edwards           Date of Birth: Apr 19, 1955           MRN: [id number] Visit Date: 10/29/2020              Requested by: Tonia Ghent, MD [address] [place],  [place] [address] PCP: Tonia Ghent, MD   Assessment & Plan: Visit Diagnoses:  1. Chronic left shoulder pain     Plan: Impression is left shoulder adhesive capsulitis.  Based on treatment options we will make a referral to Dr. Ernestina Patches for fluoroscopic guided glenohumeral injection.  I have also placed a referral for outpatient PT.  Recheck in about 8 weeks.  She will follow-up with me as scheduled for the elbow.  Follow-Up Instructions: Return in about 8 weeks (around 12/24/2020).   Orders:  Orders Placed This Encounter  Procedures   XR Shoulder Left   Ambulatory referral to Physical Medicine Rehab   Ambulatory referral to Physical Therapy   Meds ordered this encounter  Medications   diclofenac (VOLTAREN) 75 MG EC tablet    Sig: Take 1 tablet (75 mg total) by mouth 2 (two) times daily.    Dispense:  30 tablet    Refill:  2      Procedures: No procedures performed   Clinical Data: No additional findings.   Subjective: Chief Complaint  Patient presents with   Left Shoulder - Pain    Joy Edwards is a 65 year old female a treated for fractured olecranon from a fall on 08/14/2020.  She is currently in physical therapy for this.  She comes in today for evaluation of a separate problem of left shoulder pain.  Initially she had some much elbow pain that the shoulder pain was not really of much concern but now the pain is actually much worse in the shoulder.  She is unable to sleep at night due to the pain.  She is right-hand dominant.  Tylenol does not help.  She endorses pain throughout the shoulder that radiates up into the neck and sometimes into the hand.  She is unable to progress in physical therapy for the elbow because of the shoulder.   Review of Systems   Constitutional: Negative.   HENT: Negative.    Eyes: Negative.   Respiratory: Negative.    Cardiovascular: Negative.   Endocrine: Negative.   Musculoskeletal: Negative.   Neurological: Negative.   Hematological: Negative.   Psychiatric/Behavioral: Negative.    All other systems reviewed and are negative.   Objective: Vital Signs: There were no vitals taken for this visit.  Physical Exam Vitals and nursing note reviewed.  Constitutional:      Appearance: She is well-developed.  Pulmonary:     Effort: Pulmonary effort is normal.  Skin:    General: Skin is warm.     Capillary Refill: Capillary refill takes less than 2 seconds.  Neurological:     Mental Status: She is alert and oriented to person, place, and time.  Psychiatric:        Behavior: Behavior normal.        Thought Content: Thought content normal.        Judgment: Judgment normal.    Ortho Exam  Left shoulder examination shows equal active and passive range of motion which are both moderately limited with severe pain.  Abduction to about 60 degrees.  External rotation 35 degrees.  Internal rotation to beltline.  Manual muscle testing of the rotator cuff is grossly intact with slight decrease in strength secondary to pain.  Specialty Comments:  No specialty comments available.  Imaging: XR Shoulder Left  Result Date: 10/29/2020 No acute or structural abnormalities.  Mild osteoarthritis of the glenohumeral joint.    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   History of nonmelanoma skin cancer 05/10/2017   Positional vertigo of left ear 04/20/2016   Vitamin D deficiency 03/29/2014   Routine general medical examination at a  health care facility 06/30/2012   Insomnia secondary to depression with anxiety 06/30/2012   Post-menopausal osteoporosis    Past Medical History:  Diagnosis Date   Depression    Osteoporosis    Skin cancer     Family History  Problem Relation Age of Onset   Anemia Mother    Alcohol abuse Father    Alcohol abuse Sister    Colon cancer Neg Hx    Breast cancer Neg Hx     Past Surgical History:  Procedure Laterality Date   APPENDECTOMY     ORIF ELBOW FRACTURE Left 08/23/2020   Procedure: OPEN REDUCTION INTERNAL FIXATION (ORIF) LEFT ELBOW/OLECRANON FRACTURE;  Surgeon: Leandrew Koyanagi, MD;  Location: [facility];  Service: Orthopedics;  Laterality: Left;   Social History   Occupational History   Not on file  Tobacco Use   Smoking status: Never   Smokeless tobacco: Never  Vaping Use   Vaping Use: Never used  Substance and Sexual Activity   Alcohol use: Not Currently    Comment: wine, 1 a day   Drug use: No   Sexual activity: Not on file

## 2020-10-29 NOTE — Therapy (Signed)
[facility] Physical Therapy [address] [place], [place], [address] Phone: [phone]   Fax:  [phone]  Physical Therapy Evaluation  Patient Details  Name: Joy Edwards MRN: [id number] Date of Birth: 1955/08/21 Referring Provider (PT): Aundra Dubin, [place]   Encounter Date: 10/29/2020   PT End of Session - 10/29/20 1055     Visit Number 1    Number of Visits 20    Date for PT Re-Evaluation 01/21/21    PT Start Time 0930    PT Stop Time 1020    PT Time Calculation (min) 50 min    Activity Tolerance Patient limited by pain    Behavior During Therapy [facility] for tasks assessed/performed             Past Medical History:  Diagnosis Date   Depression    Osteoporosis    Skin cancer     Past Surgical History:  Procedure Laterality Date   APPENDECTOMY     ORIF ELBOW FRACTURE Left 08/23/2020   Procedure: OPEN REDUCTION INTERNAL FIXATION (ORIF) LEFT ELBOW/OLECRANON FRACTURE;  Surgeon: Leandrew Koyanagi, MD;  Location: [facility];  Service: Orthopedics;  Laterality: Left;    There were no vitals filed for this visit.    Subjective Assessment - 10/29/20 0938     Subjective She slipped on hardwood and landed on her elbow causing fracture. She had ORIF 08/23/20 . She is having most of the pain in her left shoulder now but does still have pain and loss of funciton in her Lt hand, elbow, shoulder, and neck    Patient Stated Goals use her hand better, straighten her elbow all the way, improve shoulder ROM    Currently in Pain? Yes    Pain Score 7     Pain Location Elbow   elbow and shoulder   Pain Orientation Left    Pain Descriptors / Indicators Aching;Constant    Pain Type Surgical pain    Pain Radiating Towards from left shoulder to her hand, denies N/T    Pain Onset More than a month ago    Pain Frequency Constant    Aggravating Factors  any activity, opening jars, reaching up    Pain Relieving Factors rest, massge, accupunture                 [facility] PT Assessment - 10/29/20 0001       Assessment   Medical Diagnosis S42.402D (ICD-10-CM) - Closed fracture of left elbow with routine healing, subsequent encounter    Referring Provider (PT) Aundra Dubin, PA-C    Hand Dominance Right    Next MD Visit 11/15/20    Prior Therapy none for this      Precautions   Precautions None      Restrictions   Other Position/Activity Restrictions none      Balance Screen   Has the patient fallen in the past 6 months No    Has the patient had a decrease in activity level because of a fear of falling?  No    Is the patient reluctant to leave their home because of a fear of falling?  No      Home Ecologist residence      Prior Function   Level of Independence Independent    Vocation Retired    Leisure farming      Cognition   Overall Cognitive Status Within Functional Limits for tasks assessed  Observation/Other Assessments   Focus on Therapeutic Outcomes (FOTO)  38% functional, goal 62%      Observation/Other Assessments-Edema    Edema --   mild edema in wrist, elbow, shoulder     Sensation   Additional Comments decreased sensation around elbow, hypersensitivity around shoulder      ROM / Strength   AROM / PROM / Strength AROM;PROM;Strength      AROM   Overall AROM Comments Lt wrist supination limited to 20 deg, full pronation    AROM Assessment Site Shoulder;Elbow;Wrist    Right/Left Shoulder Left    Left Shoulder Flexion 120 Degrees    Left Shoulder ABduction 90 Degrees    Left Shoulder Internal Rotation --   L4 behind back   Left Shoulder External Rotation --   neutral only   Right/Left Elbow Left    Left Elbow Flexion 90    Left Elbow Extension -30    Right/Left Wrist Left    Left Wrist Extension 20 Degrees    Left Wrist Flexion 60 Degrees      PROM   Overall PROM Comments Lt elbow PROM 95 flexion and -25 extension. Lt shoulder PROM flexion 130, abduction  90, ER to neutral with high irritability with these      Strength   Overall Strength Comments Rt grip strength 49.8, Lt grip strength 12.8. Lt wrist and elbow strength 3+, did not test Lt shoulder due to pain and irritability will await xray.                        Objective measurements completed on examination: See above findings.       [facility] PT Treatment/Exercise - 10/29/20 0001       Exercises   Exercises Elbow      Elbow Exercises   Elbow Flexion Left;5 reps    Bar Weights/Barbell (Elbow Flexion) 1 lb    Elbow Flexion Limitations holding 10 sec at bottom for end range extension stretching    Forearm Supination Left;10 reps    Bar Weights/Barbell (Forearm Supination) 1 lb    Other elbow exercises Lt wrist extension and flexion 1# X10 ea      Manual Therapy   Manual therapy comments Lt elbow PROM to tolerance with overpressure                     PT Education - 10/29/20 1054     Education Details HEP,POC,recommendations to see MD for shoulder XR    Person(s) Educated Patient    Methods Explanation;Demonstration;Verbal cues;Handout    Comprehension Verbalized understanding;Returned demonstration;Need further instruction              PT Short Term Goals - 10/29/20 1107       PT SHORT TERM GOAL #1   Title be independent in initial HEP    Time 4    Period Weeks    Status New    Target Date 11/26/20      PT SHORT TERM GOAL #2   Title To improve Lt wrist and elbow strength to 4/5    Time 4    Period Weeks    Status New    Target Date 11/26/20      PT SHORT TERM GOAL #3   Title to improve left elbow PROM flexion >100, and extension less than 15 degrees from 0    Time 4    Period Weeks    Status New  PT Long Term Goals - 10/29/20 1111       PT LONG TERM GOAL #1   Title To improve FOTO to 62% functional    Time 12    Period Weeks    Status New    Target Date 01/21/21      PT LONG TERM GOAL #2    Title To improve Lt wrist and elbow strength to 5/5 MMT, Lt grip strength to 40 lbs, Lt shoulder strength to 4+    Time 12    Period Weeks    Status New      PT LONG TERM GOAL #3   Title 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 She will benefit from skilled PT to address her functional deficits in ROM, strength, pain, and functional use of her Lt arm. Due to her severe Lt shoulder pain, we recommeded she have follow up with MD for imaging of Lt shoulder before we preceede with any therapy for this. 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    Personal Factors and Comorbidities Comorbidity 1    Comorbidities Osteoporosis    Examination-Activity Limitations Carry;Lift;Sleep;Dressing;Reach Overhead    Examination-Participation Restrictions Cleaning;Driving;Laundry;Yard Work    Merchant navy officer Evolving/Moderate complexity    Clinical Decision Making Moderate    Rehab Potential Good    PT Frequency 2x / week    PT Duration 12 weeks    PT Treatment/Interventions ADLs/Self Care Home Management;Cryotherapy;Electrical Stimulation;Moist Heat;Therapeutic activities;Iontophoresis 4mg /ml Dexamethasone;Ultrasound;Therapeutic exercise;Neuromuscular re-education;Manual techniques;Passive range of motion;Dry needling;Joint Manipulations;Vasopneumatic Device;Taping    PT Next Visit Plan review imaging report for XR of left shoulder. Work on [employer] elbow wrist/elbow ROM and stretching and strength, consider DN in future    PT Home  Exercise Plan Access Code: [email or web address]    Consulted and Agree with Plan of Care Patient             Patient will benefit from skilled therapeutic intervention in order to improve the following deficits and impairments:  Decreased activity tolerance, Decreased mobility, Decreased range of motion, Decreased strength, Hypomobility, Impaired UE functional use  Visit Diagnosis: Pain in left elbow  Stiffness of left elbow, not elsewhere classified  Muscle weakness (generalized)  Acute pain of left shoulder     Problem List Patient Active Problem List   Diagnosis Date Noted   Closed olecranon fracture, left, initial encounter 08/14/2020   Advance care planning 07/28/2020   Dyshidrosis 07/28/2020   Health care maintenance 07/28/2020   Sensorineural hearing loss (SNHL) of both ears 07/24/2019   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 [place], [place], [address] Phone: [phone]   Fax:  [phone]  Name: RUPINDER LIVINGSTON MRN: [id number] Date of Birth: 06-03-1955

## 2020-10-30 ENCOUNTER — Ambulatory Visit: Payer: Self-pay

## 2020-10-30 ENCOUNTER — Encounter: Payer: Self-pay | Admitting: Physical Medicine and Rehabilitation

## 2020-10-30 ENCOUNTER — Ambulatory Visit (INDEPENDENT_AMBULATORY_CARE_PROVIDER_SITE_OTHER): Payer: PPO | Admitting: Physical Medicine and Rehabilitation

## 2020-10-30 DIAGNOSIS — G8929 Other chronic pain: Secondary | ICD-10-CM | POA: Diagnosis not present

## 2020-10-30 DIAGNOSIS — M25512 Pain in left shoulder: Secondary | ICD-10-CM

## 2020-10-30 NOTE — Progress Notes (Signed)
Pt state left shoulder pain. Pt state she can't lift her arm due to the pain it causes. Pt state she had surgery on her left arm and she has plates and screw in her arm. Pt state she take pain meds to help ease her pain,  Numeric Pain Rating Scale and Functional Assessment Average Pain 4   In the last MONTH (on 0-10 scale) has pain interfered with the following?  1. General activity like being  able to carry out your everyday physical activities such as walking, climbing stairs, carrying groceries, or moving a chair?  Rating(10)   -BT, -Dye Allergies.

## 2020-11-03 MED ORDER — TRIAMCINOLONE ACETONIDE 40 MG/ML IJ SUSP
80.0000 mg | INTRAMUSCULAR | Status: AC | PRN
  Administered 2020-10-30: 80 mg via INTRA_ARTICULAR

## 2020-11-03 MED ORDER — BUPIVACAINE HCL 0.25 % IJ SOLN
4.0000 mL | INTRAMUSCULAR | Status: AC | PRN
  Administered 2020-10-30: 4 mL via INTRA_ARTICULAR

## 2020-11-03 NOTE — Progress Notes (Signed)
   CYBIL SENEGAL - 65 y.o. female MRN [id number]  Date of birth: 1955/10/09  Office Visit Note: Visit Date: 10/30/2020 PCP: Tonia Ghent, MD Referred by: Tonia Ghent, MD  Subjective: Chief Complaint  Patient presents with   Left Shoulder - Pain   HPI:  KARIANNA GUSMAN is a 65 y.o. female who comes in today at the request of Dr. Eduard Roux for planned Left anesthetic glenohumeral arthrogram with fluoroscopic guidance.  The patient has failed conservative care including home exercise, medications, time and activity modification.  This injection will be diagnostic and hopefully therapeutic.  Please see requesting physician notes for further details and justification.   ROS Otherwise per HPI.  Assessment & Plan: Visit Diagnoses:    ICD-10-CM   1. Chronic left shoulder pain  M25.512 XR C-ARM NO REPORT   G89.29       Plan: No additional findings.   Meds & Orders: No orders of the defined types were placed in this encounter.   Orders Placed This Encounter  Procedures   Large Joint Inj   XR C-ARM NO REPORT    Follow-up: Return for visit to requesting physician as needed.   Procedures: Large Joint Inj: L glenohumeral on 10/30/2020 2:15 PM Indications: pain and diagnostic evaluation Details: 22 G 3.5 in needle, anteromedial approach  Arthrogram: Yes  Medications: 80 mg triamcinolone acetonide 40 MG/ML; 4 mL bupivacaine 0.25 %  Arthrogram demonstrated excellent flow of contrast throughout the joint surface without extravasation or obvious defect.  The patient had relief of symptoms during the anesthetic phase of the injection.  Procedure, treatment alternatives, risks and benefits explained, specific risks discussed. Consent was given by the patient. Immediately prior to procedure a time out was called to verify the correct patient, procedure, equipment, support staff and site/side marked as required. Patient was prepped and draped in the usual sterile fashion.          Clinical History: No specialty comments available.     Objective:  VS:  HT:    WT:   BMI:     BP:   HR: bpm  TEMP: ( )  RESP:  Physical Exam   Imaging: No results found.

## 2020-11-05 ENCOUNTER — Other Ambulatory Visit: Payer: Self-pay

## 2020-11-05 ENCOUNTER — Encounter: Payer: Self-pay | Admitting: Physical Therapy

## 2020-11-05 ENCOUNTER — Ambulatory Visit: Payer: PPO | Admitting: Physical Therapy

## 2020-11-05 DIAGNOSIS — M25522 Pain in left elbow: Secondary | ICD-10-CM | POA: Diagnosis not present

## 2020-11-05 DIAGNOSIS — M25512 Pain in left shoulder: Secondary | ICD-10-CM | POA: Diagnosis not present

## 2020-11-05 DIAGNOSIS — M6281 Muscle weakness (generalized): Secondary | ICD-10-CM

## 2020-11-05 DIAGNOSIS — M25622 Stiffness of left elbow, not elsewhere classified: Secondary | ICD-10-CM | POA: Diagnosis not present

## 2020-11-05 NOTE — Therapy (Signed)
[facility] Physical Therapy [address] [place], [place], [address] Phone: [phone]   Fax:  [phone]  Physical Therapy Treatment  Patient Details  Name: Joy Edwards MRN: [id number] Date of Birth: 1955/06/23 Referring Provider (PT): Aundra Dubin, [place]   Encounter Date: 11/05/2020   PT End of Session - 11/05/20 1541     Visit Number 2    Number of Visits 20    Date for PT Re-Evaluation 01/21/21    PT Start Time 1510    PT Stop Time 1555    PT Time Calculation (min) 45 min    Activity Tolerance Patient limited by pain    Behavior During Therapy [facility] for tasks assessed/performed             Past Medical History:  Diagnosis Date   Depression    Osteoporosis    Skin cancer     Past Surgical History:  Procedure Laterality Date   APPENDECTOMY     ORIF ELBOW FRACTURE Left 08/23/2020   Procedure: OPEN REDUCTION INTERNAL FIXATION (ORIF) LEFT ELBOW/OLECRANON FRACTURE;  Surgeon: Leandrew Koyanagi, MD;  Location: [facility];  Service: Orthopedics;  Laterality: Left;    There were no vitals filed for this visit.   Subjective Assessment - 11/05/20 1520     Subjective Pt arriving today s/p left soulder injection and pt is able to lift her arm today with mild pain of 2/10. Pt reporting 2/10.    Patient Stated Goals use her hand better, straighten her elbow all the way, improve shoulder ROM    Currently in Pain? Yes    Pain Score 2     Pain Location Elbow    Pain Orientation Left    Pain Descriptors / Indicators Sore;Aching    Pain Type Surgical pain    Pain Onset More than a month ago    Multiple Pain Sites Yes    Pain Score 2    Pain Location Shoulder    Pain Orientation Left    Pain Descriptors / Indicators Sore    Pain Type Acute pain    Pain Onset More than a month ago    Pain Frequency Intermittent                [facility] PT Assessment - 11/05/20 0001       Assessment   Medical Diagnosis S42.402D (ICD-10-CM) -  Closed fracture of left elbow with routine healing, subsequent encounter    Referring Provider (PT) Aundra Dubin, PA-C    Hand Dominance Right    Next MD Visit 11/15/20    Prior Therapy none for this      AROM   Right/Left Shoulder Left    Left Shoulder Flexion 148 Degrees    Left Shoulder ABduction 120 Degrees    Left Shoulder External Rotation 45 Degrees   shoulder abd 45 degrees     Strength   Strength Assessment Site Shoulder    Right/Left Shoulder Left    Left Shoulder Flexion 4-/5    Left Shoulder Extension 4-/5    Left Shoulder ABduction 4-/5    Left Shoulder Internal Rotation 4-/5    Left Shoulder External Rotation 4-/5                           [facility] Adult PT Treatment/Exercise - 11/05/20 0001       Exercises   Exercises Elbow;Shoulder      Elbow Exercises  Elbow Flexion AROM;Both;15 reps;Seated    Bar Weights/Barbell (Elbow Flexion) 2 lbs   bar   Forearm Supination Left;10 reps    Bar Weights/Barbell (Forearm Supination) 2 lbs    Other elbow exercises Lt wrist extension and flexion 1# X10 ea      Shoulder Exercises: Supine   External Rotation AAROM;Left;15 reps;Weights    Theraband Level (Shoulder External Rotation) Level 2 (Red);Other (comment)   x 10 (money position)   External Rotation Limitations 2# bar    Flexion AAROM;Left;15 reps;Weights    Flexion Limitations 2# bar      Shoulder Exercises: Seated   Other Seated Exercises yellow therapy putty issued for grip strength, twisting motion using bilateral hands      Shoulder Exercises: Standing   Other Standing Exercises wall laddder x 3 left UE    Other Standing Exercises UE ranger: circles both directions x 30 seconds each      Shoulder Exercises: Pulleys   Flexion 2 minutes    Scaption 2 minutes      Shoulder Exercises: ROM/Strengthening   UBE (Upper Arm Bike) L1.5 x 4 minutes (2 minutes each direction)      Manual Therapy   Manual therapy comments Lt elbow PROM to  tolerance with overpressure                     PT Education - 11/05/20 1541     Education Details HEP update with added shoulder exercises    Person(s) Educated Patient    Methods Explanation;Demonstration;Handout    Comprehension Returned demonstration;Verbalized understanding              PT Short Term Goals - 11/05/20 1603       PT SHORT TERM GOAL #1   Title be independent in initial HEP    Status On-going      PT SHORT TERM GOAL #2   Title To improve Lt wrist and elbow strength to 4/5    Status On-going      PT SHORT TERM GOAL #3   Title to improve left elbow PROM flexion >100, and extension less than 15 degrees from 0    Status On-going               PT Long Term Goals - 10/29/20 1111       PT LONG TERM GOAL #1   Title To improve FOTO to 62% functional    Time 12    Period Weeks    Status New    Target Date 01/21/21      PT LONG TERM GOAL #2   Title To improve Lt wrist and elbow strength to 5/5 MMT, Lt grip strength to 40 lbs, Lt shoulder strength to 4+    Time 12    Period Weeks    Status New      PT LONG TERM GOAL #3   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 Pt able to lifting shoulder with pain </= 2/10. Pt with grossly 4-/5 strength in left shoulder with improved ROM measurements today. Pt tolerating both shoulder and elbow exercises well with no reports of incresaed pain. Conitnue skilled PT progressing toward pt's maximal funciton.     Personal Factors and Comorbidities Comorbidity 1    Comorbidities Osteoporosis    Examination-Activity Limitations Carry;Lift;Sleep;Dressing;Reach Overhead    Examination-Participation Restrictions Cleaning;Driving;Laundry;Yard Work    Merchant navy officer Evolving/Moderate complexity    Rehab Potential Good    PT Frequency 2x / week    PT Duration 12 weeks    PT Treatment/Interventions ADLs/Self Care Home Management;Cryotherapy;Electrical Stimulation;Moist Heat;Therapeutic activities;Iontophoresis 4mg /ml Dexamethasone;Ultrasound;Therapeutic exercise;Neuromuscular re-education;Manual techniques;Passive range of motion;Dry needling;Joint Manipulations;Vasopneumatic Device;Taping    PT Next Visit Plan Work on Lt elbow wrist/elbow ROM and stretching and strength, consider DN in future    PT Home Exercise Plan Access Code: [email or web address]    Consulted and Agree with Plan of Care Patient             Patient will benefit from skilled therapeutic intervention in order to improve the following deficits and impairments:  Decreased activity tolerance, Decreased mobility, Decreased range of motion, Decreased strength, Hypomobility, Impaired UE functional use  Visit Diagnosis: Pain in left elbow  Stiffness of left elbow, not elsewhere classified  Muscle weakness (generalized)  Acute pain of left shoulder     Problem List Patient Active Problem List   Diagnosis Date Noted   Closed olecranon fracture, left, initial encounter 08/14/2020   Advance care planning 07/28/2020   Dyshidrosis 07/28/2020   Health care maintenance 07/28/2020   Sensorineural hearing loss (SNHL) of both ears 07/24/2019   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 [place], [place], [address] Phone: [phone]   Fax:  [phone]  Name: Joy Edwards MRN: [id number] Date of Birth: May 22, 1955

## 2020-11-05 NOTE — Patient Instructions (Signed)
Access Code: [email or web address] URL: [email or web address] Date: 11/05/2020 Prepared by: Kearney Hard  Exercises Seated Wrist Supination Pronation with Can - 2 x daily - 6 x weekly - 2-3 sets - 10 reps Wrist Flexion with Dumbbell - 2 x daily - 6 x weekly - 2-3 sets - 10 reps Wrist Extension with Dumbbell - 2 x daily - 6 x weekly - 2-3 sets - 10 reps Supported Elbow Flexion Extension PROM - 2 x daily - 6 x weekly - 3 sets - 10 reps - 20 sec hold Seated Single Arm Bicep Curls with Rotation and Dumbbell - 2 x daily - 6 x weekly - 1-2 sets - 10 reps - 10 seconds at bottom hold Seated Shoulder External Rotation PROM on Table - 2-3 x daily - 6 x weekly - 1-2 sets - 10 reps - 5-10 sec hold Seated Shoulder Flexion Towel Slide at Table Top Full Range of Motion - 2 x daily - 6 x weekly - 1-2 sets - 10 reps - 5-10 sec hold Seated Shoulder Abduction Towel Slide at Table Top with Forearm in Neutral - 4-5 x daily - 6 x weekly - 1-2 sets - 10 reps - 5-10 sec hold Supine Shoulder Flexion Extension AAROM with Dowel - 2-3 x daily - 7 x weekly - 2 sets - 10 reps - 3 seconds hold Supine Shoulder External Rotation in 45 Degrees Abduction AAROM with Dowel - 2 x daily - 7 x weekly - 2 sets - 10 reps - 3 seconds hold Shoulder External Rotation and Scapular Retraction with Resistance - 2 x daily - 7 x weekly - 2 sets - 10 reps

## 2020-11-11 ENCOUNTER — Encounter: Payer: PPO | Admitting: Physical Therapy

## 2020-11-12 ENCOUNTER — Ambulatory Visit: Payer: PPO | Admitting: Physical Therapy

## 2020-11-12 ENCOUNTER — Other Ambulatory Visit: Payer: Self-pay

## 2020-11-12 ENCOUNTER — Encounter: Payer: Self-pay | Admitting: Family Medicine

## 2020-11-12 ENCOUNTER — Telehealth: Payer: Self-pay | Admitting: Family Medicine

## 2020-11-12 DIAGNOSIS — M6281 Muscle weakness (generalized): Secondary | ICD-10-CM

## 2020-11-12 DIAGNOSIS — M25512 Pain in left shoulder: Secondary | ICD-10-CM | POA: Diagnosis not present

## 2020-11-12 DIAGNOSIS — M25522 Pain in left elbow: Secondary | ICD-10-CM | POA: Diagnosis not present

## 2020-11-12 DIAGNOSIS — M810 Age-related osteoporosis without current pathological fracture: Secondary | ICD-10-CM | POA: Diagnosis not present

## 2020-11-12 DIAGNOSIS — S52023A Displaced fracture of olecranon process without intraarticular extension of unspecified ulna, initial encounter for closed fracture: Secondary | ICD-10-CM | POA: Diagnosis not present

## 2020-11-12 DIAGNOSIS — M25622 Stiffness of left elbow, not elsewhere classified: Secondary | ICD-10-CM

## 2020-11-12 NOTE — Therapy (Signed)
[facility] Physical Therapy [address] [place], [place], [address] Phone: [phone]   Fax:  [phone]  Physical Therapy Treatment  Patient Details  Name: Joy Edwards MRN: [id number] Date of Birth: 1955-10-17 Referring Provider (PT): Aundra Dubin, [place]   Encounter Date: 11/12/2020   PT End of Session - 11/12/20 1607     Visit Number 3    Number of Visits 20    Date for PT Re-Evaluation 01/21/21    PT Start Time 9211    PT Stop Time 1555    PT Time Calculation (min) 40 min    Activity Tolerance Patient limited by pain    Behavior During Therapy [facility] for tasks assessed/performed             Past Medical History:  Diagnosis Date   Depression    Osteoporosis    Skin cancer     Past Surgical History:  Procedure Laterality Date   APPENDECTOMY     ORIF ELBOW FRACTURE Left 08/23/2020   Procedure: OPEN REDUCTION INTERNAL FIXATION (ORIF) LEFT ELBOW/OLECRANON FRACTURE;  Surgeon: Leandrew Koyanagi, MD;  Location: [place];  Service: Orthopedics;  Laterality: Left;    There were no vitals filed for this visit.   Subjective Assessment - 11/12/20 1605     Subjective Pt arriving today reporting stiffness in her left shoulder and elbow.    Patient Stated Goals use her hand better, straighten her elbow all the way, improve shoulder ROM    Currently in Pain? Yes    Pain Score 3     Pain Location Elbow    Pain Orientation Left    Pain Descriptors / Indicators Sore;Tightness    Pain Type Surgical pain    Pain Onset More than a month ago    Pain Score 2    Pain Location Shoulder    Pain Orientation Left    Pain Descriptors / Indicators Sore    Pain Type Acute pain                               [facility] Adult PT Treatment/Exercise - 11/12/20 0001       Exercises   Exercises Elbow;Shoulder      Elbow Exercises   Elbow Flexion AROM;Both;15 reps;Seated    Bar Weights/Barbell (Elbow Flexion) 2 lbs   bar    Forearm Supination Left;10 reps    Bar Weights/Barbell (Forearm Supination) 2 lbs      Shoulder Exercises: Supine   Theraband Level (Shoulder External Rotation) --   x 10 (money position)     Shoulder Exercises: Standing   External Rotation Strengthening;AROM;Left;10 reps;Theraband    Theraband Level (Shoulder External Rotation) Level 2 (Red)    Internal Rotation AROM;Strengthening;Left;10 reps;Theraband    Theraband Level (Shoulder Internal Rotation) Level 2 (Red)    Row Strengthening;Both;20 reps;Theraband    Theraband Level (Shoulder Row) Level 2 (Red)    Other Standing Exercises UE ranger: circles both directions x 30 seconds each      Shoulder Exercises: Pulleys   Flexion 2 minutes    ABduction 2 minutes      Shoulder Exercises: ROM/Strengthening   UBE (Upper Arm Bike) L1.5 x 4 minutes (2 minutes each direction)      Shoulder Exercises: Stretch   Corner Stretch 5 reps;10 seconds    Wall Stretch - Flexion Limitations using red physioball x 5 holding 10 seconds  Manual Therapy   Manual therapy comments Lt elbow PROM to tolerance with overpressure                       PT Short Term Goals - 11/12/20 1617       PT SHORT TERM GOAL #1   Title be independent in initial HEP    Status Achieved      PT SHORT TERM GOAL #2   Title To improve Lt wrist and elbow strength to 4/5    Status On-going      PT SHORT TERM GOAL #3   Title to improve left elbow PROM flexion >100, and extension less than 15 degrees from 0    Status On-going               PT Long Term Goals - 10/29/20 1111       PT LONG TERM GOAL #1   Title To improve FOTO to 62% functional    Time 12    Period Weeks    Status New    Target Date 01/21/21      PT LONG TERM GOAL #2   Title To improve Lt wrist and elbow strength to 5/5 MMT, Lt grip strength to 40 lbs, Lt shoulder strength to 4+    Time 12    Period Weeks    Status New      PT LONG TERM GOAL #3   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 Pt stating she is almost able to reach her mouth with her left hand. Pt tolerating both shoulder and elbow ROM and gentle strengthening. AROM left elbow flexion was 118 degrees and extension was -20 degrees. Continue with skilled PT to maximize function.    Personal Factors and Comorbidities Comorbidity 1    Comorbidities Osteoporosis    Examination-Activity Limitations Carry;Lift;Sleep;Dressing;Reach Overhead    Examination-Participation Restrictions Cleaning;Driving;Laundry;Yard Work    [employer]chant [employer] officer Evolving/Moderate complexity    Rehab Potential Good    PT Frequency 2x / week    PT Duration 12 weeks    PT Treatment/Interventions ADLs/Self Care Home Management;Cryotherapy;Electrical Stimulation;Moist Heat;Therapeutic activities;Iontophoresis 4mg /ml Dexamethasone;Ultrasound;Therapeutic exercise;Neuromuscular re-education;Manual techniques;Passive range of motion;Dry needling;Joint Manipulations;Vasopneumatic Device;Taping    PT Next Visit Plan left wrist/elbow/shoulder ROM and stretching and strength, consider DN in future    PT Home Exercise Plan Access Code: [email or web address]    Consulted and Agree with Plan of Care Patient             Patient will benefit from skilled therapeutic intervention in order to improve the following deficits and impairments:  Decreased activity tolerance, Decreased mobility, Decreased range of motion, Decreased strength, Hypomobility, Impaired UE functional use  Visit Diagnosis: Pain in left  elbow  Stiffness of left elbow, not elsewhere classified  Muscle weakness (generalized)  Acute pain of left shoulder     Problem List Patient Active Problem List   Diagnosis Date Noted   Closed olecranon fracture, left, initial encounter 08/14/2020   Advance care planning 07/28/2020   Dyshidrosis 07/28/2020   Health care maintenance 07/28/2020   Sensorineural hearing loss (SNHL) of both ears 07/24/2019   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

## 2020-11-13 NOTE — Telephone Encounter (Signed)
McKissick, Joy Edwards  You 22 hours ago (2:37 PM)   YM Patient said her bone scan was -4.7 and she has broken her elbow. She would like the medication sent she said it is Fosmax. Her pharmacy is [facility]     =============================== I need extra information.  Please request a copy of the bone density report so we will have it here in her file.  Did she follow-up with Dr. Collene Mares with GI about dysphagia?  If so, then please request copy of that note.  If not, then I need her to follow-up with her prior to considering restarting Fosamax.  Thanks.

## 2020-11-14 NOTE — Telephone Encounter (Signed)
LMTCB; need to know where scan was done so I can get copy of results and also see if she would like to make an appt with Dr. Damita Dunnings in the meantime to discuss this.

## 2020-11-15 ENCOUNTER — Other Ambulatory Visit: Payer: Self-pay

## 2020-11-15 ENCOUNTER — Ambulatory Visit: Payer: PPO | Admitting: Orthopaedic Surgery

## 2020-11-15 ENCOUNTER — Ambulatory Visit: Payer: PPO | Admitting: Physical Therapy

## 2020-11-15 ENCOUNTER — Ambulatory Visit: Payer: Self-pay

## 2020-11-15 ENCOUNTER — Encounter: Payer: Self-pay | Admitting: Orthopaedic Surgery

## 2020-11-15 DIAGNOSIS — M7502 Adhesive capsulitis of left shoulder: Secondary | ICD-10-CM

## 2020-11-15 DIAGNOSIS — S42402D Unspecified fracture of lower end of left humerus, subsequent encounter for fracture with routine healing: Secondary | ICD-10-CM | POA: Diagnosis not present

## 2020-11-15 DIAGNOSIS — M25622 Stiffness of left elbow, not elsewhere classified: Secondary | ICD-10-CM

## 2020-11-15 DIAGNOSIS — M25522 Pain in left elbow: Secondary | ICD-10-CM

## 2020-11-15 DIAGNOSIS — M25512 Pain in left shoulder: Secondary | ICD-10-CM

## 2020-11-15 DIAGNOSIS — M6281 Muscle weakness (generalized): Secondary | ICD-10-CM | POA: Diagnosis not present

## 2020-11-15 MED ORDER — CEPHALEXIN 500 MG PO CAPS: 500.0000 mg | ORAL_CAPSULE | Freq: Four times a day (QID) | ORAL | 0 refills | Status: DC

## 2020-11-15 NOTE — Progress Notes (Signed)
Office Visit Note   Patient: Joy Edwards           Date of Birth: [birth year]           MRN: [id number] Visit Date: 11/15/2020              Requested by: Tonia Ghent, MD [address] [place],  [place] [address] PCP: Tonia Ghent, MD   Assessment & Plan: Visit Diagnoses:  1. Closed fracture of left elbow with routine healing, subsequent encounter   2. Adhesive capsulitis of left shoulder     Plan: [name] returns today for follow-up of left olecranon ORIF on 08/23/2020 and left shoulder adhesive capsulitis.  She is doing much better on both fronts.  In terms of the left elbow she is making progress in range of motion and strength.  Elbow flexion and extension is 10 to 125 degrees with full pronation and supination.  Appears that she may have a little stitch abscess someone to call in some Keflex.  In regards to the left shoulder she has symmetric external and internal rotation to the unaffected side and lacks about 10 to 15 degrees of full forward flexion compared to the other side.  Strength intact.  From my standpoint [name] has improve quite a bit and she is very happy overall.  She will continue with her home exercises for both the elbow and the shoulder.  She does not need another cortisone injection in the shoulder.  I will send in some Keflex for the stitch abscess.  At this point she can follow-up with [place] as needed.  Follow-Up Instructions: Return if symptoms worsen or fail to improve.   Orders:  No orders of the defined types were placed in this encounter.  Meds ordered this encounter  Medications   cephALEXin (KEFLEX) 500 MG capsule    Sig: Take 1 capsule (500 mg total) by mouth 4 (four) times daily.    Dispense:  40 capsule    Refill:  0      Procedures: No procedures performed   Clinical Data: No additional findings.   Subjective: Chief Complaint  Patient presents with   Left Elbow - Routine Post Op    HPI  Review of  Systems   Objective: Vital Signs: There were no vitals taken for this visit.  Physical Exam  Ortho Exam  Specialty Comments:  No specialty comments available.  Imaging: No results found.   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   History of nonmelanoma skin cancer 05/10/2017   Positional vertigo of left ear 04/20/2016   Vitamin D deficiency 03/29/2014   Routine general medical examination at a health care facility 06/30/2012   Insomnia secondary to depression with anxiety 06/30/2012   Post-menopausal osteoporosis    Past Medical History:  Diagnosis Date   Depression    Osteoporosis    Skin cancer     Family History  Problem Relation Age of Onset   Anemia Mother    Alcohol abuse Father    Alcohol abuse Sister    Colon cancer Neg Hx    Breast cancer Neg Hx     Past Surgical History:  Procedure Laterality Date   APPENDECTOMY  ORIF ELBOW FRACTURE Left 08/23/2020   Procedure: OPEN REDUCTION INTERNAL FIXATION (ORIF) LEFT ELBOW/OLECRANON FRACTURE;  Surgeon: Leandrew Koyanagi, MD;  Location: [place];  Service: Orthopedics;  Laterality: Left;   Social History   Occupational History   Not on file  Tobacco Use   Smoking status: Never   Smokeless tobacco: Never  Vaping Use   Vaping Use: Never used  Substance and Sexual Activity   Alcohol use: Not Currently    Comment: wine, 1 a day   Drug use: No   Sexual activity: Not on file

## 2020-11-15 NOTE — Therapy (Signed)
[facility] Physical Therapy [address] [place], [place], [address] Phone: [phone]   Fax:  [phone]  Physical Therapy Treatment/MD progress note  Patient Details  Name: Joy Edwards MRN: [id number] Date of Birth: [birth year] Referring Provider (PT): Aundra Dubin, [place]   Encounter Date: 11/15/2020   PT End of Session - 11/15/20 0902     Visit Number 4    Number of Visits 20    Date for PT Re-Evaluation 01/21/21    PT Start Time 0819    PT Stop Time 0900    PT Time Calculation (min) 41 min    Activity Tolerance Patient tolerated treatment well    Behavior During Therapy [facility] for tasks assessed/performed             Past Medical History:  Diagnosis Date   Depression    Osteoporosis    Skin cancer     Past Surgical History:  Procedure Laterality Date   APPENDECTOMY     ORIF ELBOW FRACTURE Left 08/23/2020   Procedure: OPEN REDUCTION INTERNAL FIXATION (ORIF) LEFT ELBOW/OLECRANON FRACTURE;  Surgeon: Leandrew Koyanagi, MD;  Location: [place];  Service: Orthopedics;  Laterality: Left;    There were no vitals filed for this visit.   Subjective Assessment - 11/15/20 0823     Subjective Pt arriving today reporting soreness and overall 5/10 pain in her Lt shoulder and elbow, she had a massage yesterday so has soreness from this.    Patient Stated Goals use her hand better, straighten her elbow all the way, improve shoulder ROM    Pain Onset More than a month ago    Aggravating Factors  putting arm behind back, lifitng anything    Pain Relieving Factors gripping putty, rest, massage    Pain Onset More than a month ago                [facility] PT Assessment - 11/15/20 0001       Assessment   Medical Diagnosis S42.402D (ICD-10-CM) - Closed fracture of left elbow with routine healing, subsequent encounter    Referring Provider (PT) Aundra Dubin, PA-C      AROM   Left Elbow Flexion 123    Left Elbow Extension -10       Strength   Overall Strength Comments Lt shoulder strength overall 4-, Lt elbow strength 4+                           [facility] Adult PT Treatment/Exercise - 11/15/20 0001       Shoulder Exercises: Standing   External Rotation Strengthening;Both;20 reps    Theraband Level (Shoulder External Rotation) Level 2 (Red)    External Rotation Limitations bilat at same time    Other Standing Exercises tricep extension bilat with red X20, bicep curl on Lt with red with slow eccentrics and holding stretch at bottom 2 sec, 2X10    Other Standing Exercises box lifitng from floor to high table 18# and progressed to 25# X 2 reps to her left and 2 reps to her Rt      Shoulder Exercises: Pulleys   Flexion 2 minutes    ABduction 2 minutes      Shoulder Exercises: ROM/Strengthening   UBE (Upper Arm Bike) L3 5 min total (switch half way)      Shoulder Exercises: Stretch   Other Shoulder Stretches doorway stretch low for Lt shoulder extension and elbow extension  stretching 10 sec X10    Other Shoulder Stretches elbow extension stretch supine 2# 60 sec X2      Manual Therapy   Manual therapy comments Lt elbow PROM to tolerance with overpressure                       PT Short Term Goals - 11/15/20 0906       PT SHORT TERM GOAL #1   Title be independent in initial HEP    Status Achieved      PT SHORT TERM GOAL #2   Title To improve Lt wrist and elbow strength to 4/5    Status Achieved      PT SHORT TERM GOAL #3   Title to improve left elbow PROM flexion >100, and extension less than 15 degrees from 0    Baseline now only lacking 10 deg    Status Achieved               PT Long Term Goals - 11/15/20 6160       PT LONG TERM GOAL #1   Title To improve FOTO to 62% functional    Time 12    Period Weeks    Status On-going      PT LONG TERM GOAL #2   Title To improve Lt wrist and elbow strength to 5/5 MMT, Lt grip strength to 40 lbs, Lt shoulder strength  to 4+    Time 12    Period Weeks    Status On-going      PT LONG TERM GOAL #3   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 She has now met her short term PT goals and making good progress toward long term goals. See updated meassurements above showing overall improved elbow and shoulder ROM and strength. 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 I had her perform box lifting starting at 18# and progressed to 25# with good form and without significant increased pain so I feel she can safely lift up to 25# but we will await MD clearance for this. PT recommending to continue POC. She will miss next 2 weeks on vacation so I updated her HEP for more exercises to do while she is gone and she shows good understanding of these.    Personal Factors and Comorbidities Comorbidity 1    Comorbidities Osteoporosis    Examination-Activity Limitations Carry;Lift;Sleep;Dressing;Reach Overhead    Examination-Participation Restrictions Cleaning;Driving;Laundry;Yard Work    Merchant [employer] officer Evolving/Moderate complexity    Rehab Potential Good    PT Frequency 2x / week    PT Duration 12 weeks    PT Treatment/Interventions ADLs/Self Care Home Management;Cryotherapy;Electrical Stimulation;Moist Heat;Therapeutic activities;Iontophoresis 63m/ml Dexamethasone;Ultrasound;Therapeutic exercise;Neuromuscular re-education;Manual techniques;Passive range of  motion;Dry needling;Joint Manipulations;Vasopneumatic Device;Taping    PT  Next Visit Plan what did MD say?    PT Home Exercise Plan Access Code: [email or web address]    Consulted and Agree with Plan of Care Patient             Patient will benefit from skilled therapeutic intervention in order to improve the following deficits and impairments:  Decreased activity tolerance, Decreased mobility, Decreased range of motion, Decreased strength, Hypomobility, Impaired UE functional use  Visit Diagnosis: Pain in left elbow  Stiffness of left elbow, not elsewhere classified  Muscle weakness (generalized)  Acute pain of left shoulder     Problem List Patient Active Problem List   Diagnosis Date Noted   Closed olecranon fracture, left, initial encounter 08/14/2020   Advance care planning 07/28/2020   Dyshidrosis 07/28/2020   Health care maintenance 07/28/2020   Sensorineural hearing loss (SNHL) of both ears 07/24/2019   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 [place], [place], [address] Phone: [phone]   Fax:  [phone]  Name: Joy Edwards MRN: [id number] Date of Birth: 1955-02-05

## 2020-11-15 NOTE — Patient Instructions (Signed)
Access Code: [email or web address] URL: [email or web address] Date: 11/15/2020 Prepared by: Elsie Ra  Exercises Supported Elbow Flexion Extension PROM - 2 x daily - 6 x weekly - 3 sets - 10 reps - 20 sec hold Shoulder External Rotation and Scapular Retraction with Resistance - 2 x daily - 7 x weekly - 2-3 sets - 10 reps Standing Shoulder Row with Anchored Resistance - 2 x daily - 6 x weekly - 2-3 sets - 10 reps Standing Tricep Extensions with Resistance - 2 x daily - 6 x weekly - 2-3 sets - 10 reps Standing Single Arm Elbow Flexion with Resistance - 2 x daily - 6 x weekly - 2-3 sets - 10 reps Supine Elbow Extension Stretch with Weight - 2 x daily - 6 x weekly - 1 sets - 3 reps - 1-2 min hold Doorway Pec Stretch at 60 Degrees Abduction with Arm Straight - 2 x daily - 6 x weekly - 1 sets - 10 reps - 10 hold

## 2020-11-20 NOTE — Telephone Encounter (Signed)
LMTCB

## 2020-11-30 DIAGNOSIS — U071 COVID-19: Secondary | ICD-10-CM | POA: Diagnosis not present

## 2020-12-02 ENCOUNTER — Telehealth: Payer: Self-pay | Admitting: *Deleted

## 2020-12-02 NOTE — Telephone Encounter (Signed)
Spoke with patient this am; states she has been gone for the past 2 weeks. Patient states someone from [facility] came out to her home and performed the dexa scan on her and she was given the results right then. She is going to try to track down who did the scan and see about getting her results sent to [place].   I dug all around and do not see anything still in the chart. Patient stated she would call back once she hears back from who did her scan.

## 2020-12-02 NOTE — Telephone Encounter (Signed)
PLEASE NOTE: All timestamps contained within this report are represented as [place] Standard Time. CONFIDENTIALTY NOTICE: This fax transmission is intended only for the addressee. It contains information that is legally privileged, confidential or otherwise protected from use or disclosure. If you are not the intended recipient, you are strictly prohibited from reviewing, disclosing, copying using or disseminating any of this information or taking any action in reliance on or regarding this information. If you have received this fax in error, please notify us immediately by telephone so that we can arrange for its return to [place]. Phone: [phone], Toll-Free: [phone], Fax: [phone] Page: 1 of 3 Call Id: [id number] [employer] RECORD [employer]urse Patient Name: Joy Edwards Gender: Female DOB: 12-Mar-1955 Age: 65 Y 27 M 3 D Return Phone Number: [phone] (Primary), [phone] (Secondary) Address: [place] [address] Client [facility] - Client Client Site [facility] Physician Renford Dills - MD Contact Type Call Who Is Calling Patient / Member / Family / Caregiver Call Type Triage / Clinical Relationship To Patient Self Return Phone Number [phone] (Primary) Chief Complaint Headache Reason for Call Symptomatic / Request for Health Information Initial Comment RECORD 1 OF 2 Caller says that her husband and she just returned from [place], and both tested positive for COVID this AM and are very sick since Friday. She has chills, sore throat, has a headache, diarrhea, she aches all over and has no appetite. Translation No Nurse Assessment Nurse: Velta Addison, RN, [place] Date/Time (Eastern Time): 11/30/2020 2:54:20 PM Confirm and document reason for call. If symptomatic, describe symptoms. ---RECORD 1 OF 2 Caller says that her husband and she  just returned from [place], and both tested positive for COVID this AM and are very sick since Friday. She has chills, sore throat, has a headache, diarrhea, she aches all over and has no appetite. Does not have thermometer. Had double pneumonia twice and gets really bad coughs Does the patient have any new or worsening symptoms? ---Yes Will a triage be completed? ---Yes Related visit to physician within the last 2 weeks? ---No Does the PT have any chronic conditions? (i.e. diabetes, asthma, this includes High risk factors for pregnancy, etc.) ---No Is this a behavioral health or substance abuse call? ---No Guidelines Guideline Title Affirmed Question Affirmed Notes Nurse Date/Time (Eastern Time) COVID-19 - Diagnosed or Suspected MILD difficulty breathing (e.g., minimal/no SOB at rest, SOB with walking, pulse <100) Parrott, RN, [place] 11/30/2020 2:55:56 PM PLEASE NOTE: All timestamps contained within this report are represented as [place] Standard Time. CONFIDENTIALTY NOTICE: This fax transmission is intended only for the addressee. It contains information that is legally privileged, confidential or otherwise protected from use or disclosure. If you are not the intended recipient, you are strictly prohibited from reviewing, disclosing, copying using or disseminating any of this information or taking any action in reliance on or regarding this information. If you have received this fax in error, please notify us immediately by telephone so that we can arrange for its return to [place]. Phone: [phone], Toll-Free: [phone], Fax: [phone] Page: 2 of 3 Call Id: [id number] [place]. Time Eilene Ghazi Time) Disposition Final User 11/30/2020 3:06:30 PM Paged On Call back to [facility], [place], [place] 11/30/2020 4:00:17 PM Paged On Call back to [facility], [place], [place] 11/30/2020 4:24:37 PM Paged On Call back to [facility], [place], [place] 11/30/2020 3:05:08 PM See HCP  within  4 Hours (or PCP triage) Yes Parrott, RN, Nature conservation officer Understands Yes PreDisposition Did not know what to do Care Advice Given Per Guideline SEE HCP (OR PCP TRIAGE) WITHIN 4 HOURS: * IF OFFICE WILL BE OPEN: You need to be seen within the next 3 or 4 hours. Call your doctor (or NP/PA) now or as soon as the office opens. CARE ADVICE given per COVID-19 - DIAGNOSED OR SUSPECTED (Adult) guideline. * You become worse CALL BACK IF: Comments User: Hamilton Capri, RN Date/Time (Eastern Time): 11/30/2020 3:02:13 PM Allergies: None [facility] [address] [employer] [phone] Denies heart/lung/liver/kidney disease or failure User: Hamilton Capri, RN Date/Time (Eastern Time): 11/30/2020 3:04:28 PM Caller states the facilities in the area will not see you if you test positive for Covid. Would just like to have the Paxlovid called in at this time. User: Hamilton Capri, RN Date/Time Eilene Ghazi Time): 11/30/2020 3:07:22 PM Unable to reach on call at this time. Mailbox full. Will try again per protocol. Referrals GO TO FACILITY UNDECIDED Paging DoctorName Phone DateTime Result/ Outcome Message Type Notes Billey Chang- MD [phone] 11/30/2020 3:06:30 PM Called on Call provider - No message left Doctor Paged Billey Chang- MD [phone] 11/30/2020 4:00:17 PM Called on Call provider - No message left Doctor Paged PLEASE NOTE: All timestamps contained within this report are represented as [place] Standard Time. CONFIDENTIALTY NOTICE: This fax transmission is intended only for the addressee. It contains information that is legally privileged, confidential or otherwise protected from use or disclosure. If you are not the intended recipient, you are strictly prohibited from reviewing, disclosing, copying using or disseminating any of this information or taking any action in reliance on or regarding this information. If you have received  this fax in error, please notify us immediately by telephone so that we can arrange for its return to [place]. Phone: [phone], Toll-Free: [phone], Fax: [phone] Page: 3 of 3 Call Id: [id number] Paging DoctorName Phone DateTime Result/ Outcome Message Type Notes Billey Chang- MD [phone] 11/30/2020 4:24:37 PM Called on Call provider - No message left Doctor Paged Billey Chang- MD 11/30/2020 4:41:08 PM Unable to Reach on call - Max Attempts Message Result Checked with lead and correct on call provider listed. Called pt and notified her it may be Monday before medication is called in. That will be day 3 for her and her husband. She is ok with that

## 2020-12-02 NOTE — Telephone Encounter (Signed)
Mrs. Joy Edwards called in and returning phone call and related the message and wanted to know about starting the medication for her bones.

## 2020-12-02 NOTE — Telephone Encounter (Signed)
See the rest of the note below.  If she just tested positive for covid, then I would get over that prior to starting any other meds.    About her bone density- I see the partial report scanned under media- it was scanned today.  Please see about getting the rest of the report.    Did she check with Dr. Collene Mares about any concerns related to osteoporosis treatment?  Please let me know.  Thanks.

## 2020-12-02 NOTE — Telephone Encounter (Signed)
Spoke to patient by telephone and was advised that she started with symptoms Friday. Patient stated that she did a home covid test Saturday which was positive. Patient stated that she has had a cough fever, chills, body aches and diarrhea. Patient stated that she did a virtual visit with [facility] Sunday and they prescribed her Molnupiravir.  Patient stated that she is drinking lots of fluids and is urinating as usual. Patient was given ER precautions and she verbalized understanding. Patient stated that she has taken 3 doses of the medication and has not noticed much improvement since starting the medication. Patient was given ER precautions and she verbalized understanding.

## 2020-12-02 NOTE — Telephone Encounter (Signed)
If not improving would offer virtual appt today or tomorrow - I could see today at 4:30pm.  If she prefers to be seen in person would have her go to [facility].

## 2020-12-02 NOTE — Telephone Encounter (Signed)
Tried calling the patient and he did not answer. LVM for patient to call back.   

## 2020-12-04 ENCOUNTER — Encounter: Payer: PPO | Admitting: Physical Therapy

## 2020-12-04 NOTE — Telephone Encounter (Signed)
Thank you for checking on her.  Please schedule a video visit with possible (or in person visit when she is feeling better) so we can talk about options for osteoporosis treatment.  Thanks.

## 2020-12-04 NOTE — Telephone Encounter (Signed)
Patient does not need a visit or treatment for COVID at this time. She was able to do a virtual visit with [facility] and get medication for this. About her bone density scan; patient had home test done which is what is scanned into the chart. Patient states she was contacted by health team [facility] to get this done. She had broken her elbow and was not doing good so this made it easier to get done when they offered to come out and do it at home. Patient has not spoken to or seen Dr. Collene Mares in years so she has not talked to her about any kind of treatments. I advised patient we may need to do a visit to discuss options at this point and she was fine with that if needed.

## 2020-12-06 NOTE — Telephone Encounter (Signed)
Patient scheduled VV on 12/12/20 at 4:00 pm.

## 2020-12-12 ENCOUNTER — Encounter: Payer: Self-pay | Admitting: Family Medicine

## 2020-12-12 ENCOUNTER — Telehealth (INDEPENDENT_AMBULATORY_CARE_PROVIDER_SITE_OTHER): Payer: PPO | Admitting: Family Medicine

## 2020-12-12 ENCOUNTER — Other Ambulatory Visit: Payer: Self-pay

## 2020-12-12 DIAGNOSIS — M810 Age-related osteoporosis without current pathological fracture: Secondary | ICD-10-CM

## 2020-12-12 MED ORDER — VITAMIN D3 25 MCG (1000 UT) PO CAPS: 1000.0000 [IU] | ORAL_CAPSULE | Freq: Every day | ORAL | Status: AC

## 2020-12-12 MED ORDER — ALENDRONATE SODIUM 70 MG PO TABS
70.0000 mg | ORAL_TABLET | ORAL | 3 refills | Status: DC
Start: 2020-12-12 — End: 2022-06-22

## 2020-12-12 NOTE — Progress Notes (Signed)
Virtual visit completed through WebEx or similar program Patient location: home  Provider location: [place] at [facility], office  Participants: Patient and me (unless stated otherwise below)  Pandemic considerations d/w pt.   Limitations and rationale for visit method d/w patient.  Patient agreed to proceed.   CC: osteoporosis.    HPI:  She is clearly better from recent covid case.  Discussed.  D/w pt about DXA results and osteoporosis path/phys in general, including vit D and calcium.  Reasonable to consider treatment with bisphosphonate, ie fosamax.  D/w pt about risk benefit, especially GI sx, jaw and long bone pathology.    She prev took fosamax prev for about 2 years.   Would need 3 more years of treatment and consider DXA later on.      D/w pt about prev eval per Dr. Collene Mares.  She has come occ sx with taking and eating at the same time.  No dysphagia o/w.  D/w pt.  She didn't think she needed to f/u with Dr. Collene Mares.   Meds and allergies reviewed.   ROS: Per HPI unless specifically indicated in ROS section   NAD Speech wnl  A/P: Osteoporosis. D/w pt about DXA results and osteoporosis path/phys in general, including vit D and calcium.  Reasonable to consider treatment with bisphosphonate, ie fosamax.  D/w pt about risk benefit, especially GI sx, jaw and long bone pathology.    She prev took fosamax prev for about 2 years.   Would need 3 more years of treatment and consider DXA later on.      In the meantime restart alendronate with routine GI cautions and other cautions as above.  She will update me if not tolerated or if she has any worsening GI symptoms.

## 2020-12-15 NOTE — Assessment & Plan Note (Signed)
D/w pt about DXA results and osteoporosis path/phys in general, including vit D and calcium.  Reasonable to consider treatment with bisphosphonate, ie fosamax.  D/w pt about risk benefit, especially GI sx, jaw and long bone pathology.    She prev took fosamax prev for about 2 years.   Would need 3 more years of treatment and consider DXA later on.      In the meantime restart alendronate with routine GI cautions and other cautions as above.  She will update me if not tolerated or if she has any worsening GI symptoms.

## 2020-12-16 ENCOUNTER — Other Ambulatory Visit: Payer: Self-pay

## 2020-12-16 ENCOUNTER — Ambulatory Visit: Payer: PPO | Admitting: Physical Therapy

## 2020-12-16 DIAGNOSIS — M6281 Muscle weakness (generalized): Secondary | ICD-10-CM

## 2020-12-16 DIAGNOSIS — M25622 Stiffness of left elbow, not elsewhere classified: Secondary | ICD-10-CM | POA: Diagnosis not present

## 2020-12-16 DIAGNOSIS — M25522 Pain in left elbow: Secondary | ICD-10-CM | POA: Diagnosis not present

## 2020-12-16 DIAGNOSIS — M25512 Pain in left shoulder: Secondary | ICD-10-CM | POA: Diagnosis not present

## 2020-12-16 NOTE — Therapy (Addendum)
[facility] Physical Therapy [address] [place], [place], [address] Phone: [phone]   Fax:  [phone]  Physical Therapy Treatment Discharge  Patient Details  Name: Joy Edwards MRN: [id number] Date of Birth: Jul 17, 1955 Referring Provider (PT): Aundra Dubin, [place]   Encounter Date: 12/16/2020   PT End of Session - 12/16/20 1117     Visit Number 5    Number of Visits 20    Date for PT Re-Evaluation 01/21/21    PT Start Time 0850    PT Stop Time 0940    PT Time Calculation (min) 50 min    Activity Tolerance Patient tolerated treatment well    Behavior During Therapy [facility] for tasks assessed/performed             Past Medical History:  Diagnosis Date   Depression    Osteoporosis    Skin cancer     Past Surgical History:  Procedure Laterality Date   APPENDECTOMY     ORIF ELBOW FRACTURE Left 08/23/2020   Procedure: OPEN REDUCTION INTERNAL FIXATION (ORIF) LEFT ELBOW/OLECRANON FRACTURE;  Surgeon: Leandrew Koyanagi, MD;  Location: [facility];  Service: Orthopedics;  Laterality: Left;    There were no vitals filed for this visit.   Subjective Assessment - 12/16/20 1111     Subjective She has missed one month of PT due to being on vacation and then had covid. She does think she is improving some    Patient Stated Goals use her hand better, straighten her elbow all the way, improve shoulder ROM    Currently in Pain? Yes    Pain Score 4     Pain Location Shoulder   and elbow   Pain Orientation Left    Pain Descriptors / Indicators Aching;Sore    Pain Onset More than a month ago    Pain Onset More than a month ago               [facility] Adult PT Treatment/Exercise - 12/16/20 0001       Shoulder Exercises: Pulleys   Flexion 2 minutes    ABduction 2 minutes      Shoulder Exercises: ROM/Strengthening   UBE (Upper Arm Bike) L3 5 min total (switch half way)      Shoulder Exercises: Stretch   Other Shoulder Stretches  doorway stretch low and mid 30 sec X2 ea    Other Shoulder Stretches elbow extension stretch supine 2# 60 sec X2      Modalities   Modalities Cryotherapy      Cryotherapy   Number Minutes Cryotherapy 10 Minutes    Cryotherapy Location Shoulder   and elbow   Type of Cryotherapy Ice pack      Manual Therapy   Manual therapy comments Lt elbow and shoulderPROM to tolerance with overpressure, elbow extension and flexion mobs                       PT Short Term Goals - 11/15/20 0906       PT SHORT TERM GOAL #1   Title be independent in initial HEP    Status Achieved      PT SHORT TERM GOAL #2   Title To improve Lt wrist and elbow strength to 4/5    Status Achieved      PT SHORT TERM GOAL #3   Title to improve left elbow PROM flexion >100, and extension less than 15 degrees from 0  Baseline now only lacking 10 deg    Status Achieved               PT Long Term Goals - 11/15/20 0907       PT LONG TERM GOAL #1   Title To improve FOTO to 62% functional    Time 12    Period Weeks    Status On-going      PT LONG TERM GOAL #2   Title To improve Lt wrist and elbow strength to 5/5 MMT, Lt grip strength to 40 lbs, Lt shoulder strength to 4+    Time 12    Period Weeks    Status On-going      PT LONG TERM GOAL #3   Title 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 I encouraged her to set up some more PT and we will monitor her  soreness.    Personal Factors and Comorbidities Comorbidity 1    Comorbidities Osteoporosis    Examination-Activity Limitations Carry;Lift;Sleep;Dressing;Reach Overhead    Examination-Participation Restrictions Cleaning;Driving;Laundry;Yard Work    Merchant navy officer Evolving/Moderate complexity    Rehab Potential Good    PT Frequency 2x / week    PT Duration 12 weeks    PT Treatment/Interventions ADLs/Self Care Home Management;Cryotherapy;Electrical Stimulation;Moist Heat;Therapeutic activities;Iontophoresis 44m/ml Dexamethasone;Ultrasound;Therapeutic exercise;Neuromuscular re-education;Manual techniques;Passive range of motion;Dry needling;Joint Manipulations;Vasopneumatic Device;Taping    PT Next Visit Plan ROM progression as tolerated.    PT Home Exercise Plan Access Code: [email or web address]   Consulted and Agree with Plan of Care Patient             Patient will benefit from skilled therapeutic intervention in order to improve the following deficits and impairments:  Decreased activity tolerance, Decreased mobility, Decreased range of motion, Decreased strength, Hypomobility, Impaired UE functional use  Visit Diagnosis: Pain in left elbow  Stiffness of left elbow, not elsewhere classified  Muscle weakness (generalized)  Acute pain of left shoulder     Problem List Patient Active Problem List   Diagnosis Date Noted   Closed olecranon fracture, left, initial encounter 08/14/2020   Advance care planning 07/28/2020   Dyshidrosis 07/28/2020   Health care maintenance 07/28/2020   Sensorineural hearing loss (SNHL) of both ears 07/24/2019   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   History of nonmelanoma skin cancer 05/10/2017   Positional vertigo of left ear 04/20/2016   Vitamin D deficiency 03/29/2014   Routine general medical examination at a health care facility 06/30/2012   Insomnia secondary to depression with anxiety 06/30/2012   Post-menopausal osteoporosis      BDebbe Odea PT,DPT 12/16/2020, 11:21 AM  [facility] [address] [address]:  [phone]   Fax:  [phone]  Name: Joy Edwards MRN: [id number] Date of Birth: [birth year]  PHYSICAL THERAPY DISCHARGE SUMMARY  Visits from Start of Care: 5  Current functional level related to goals / functional outcomes: See above   Remaining deficits: See above   Education / Equipment: HEP   Patient agrees to discharge. Patient goals were partially met. Patient is being discharged due to not returning since the last visit.   Kearney Hard, PT, MPT 01/28/21 9:09 AM

## 2020-12-24 ENCOUNTER — Ambulatory Visit: Payer: PPO | Admitting: Orthopaedic Surgery

## 2020-12-24 ENCOUNTER — Encounter: Payer: PPO | Admitting: Physical Therapy

## 2020-12-31 ENCOUNTER — Encounter: Payer: PPO | Admitting: Physical Therapy

## 2021-01-01 ENCOUNTER — Other Ambulatory Visit: Payer: Self-pay

## 2021-01-01 ENCOUNTER — Ambulatory Visit (INDEPENDENT_AMBULATORY_CARE_PROVIDER_SITE_OTHER): Payer: PPO

## 2021-01-01 ENCOUNTER — Ambulatory Visit: Payer: PPO | Admitting: Orthopaedic Surgery

## 2021-01-01 ENCOUNTER — Encounter: Payer: Self-pay | Admitting: Orthopaedic Surgery

## 2021-01-01 DIAGNOSIS — M25562 Pain in left knee: Secondary | ICD-10-CM

## 2021-01-01 DIAGNOSIS — M7502 Adhesive capsulitis of left shoulder: Secondary | ICD-10-CM

## 2021-01-01 DIAGNOSIS — S52022A Displaced fracture of olecranon process without intraarticular extension of left ulna, initial encounter for closed fracture: Secondary | ICD-10-CM | POA: Diagnosis not present

## 2021-01-01 MED ORDER — LIDOCAINE HCL 1 % IJ SOLN
2.0000 mL | INTRAMUSCULAR | Status: AC | PRN
Start: 2021-01-01 — End: 2021-01-01
  Administered 2021-01-01: 2 mL

## 2021-01-01 MED ORDER — METHYLPREDNISOLONE ACETATE 40 MG/ML IJ SUSP
40.0000 mg | INTRAMUSCULAR | Status: AC | PRN
  Administered 2021-01-01: 40 mg via INTRA_ARTICULAR

## 2021-01-01 MED ORDER — BUPIVACAINE HCL 0.5 % IJ SOLN
2.0000 mL | INTRAMUSCULAR | Status: AC | PRN
  Administered 2021-01-01: 2 mL via INTRA_ARTICULAR

## 2021-01-01 NOTE — Progress Notes (Signed)
Office Visit Note   Patient: Joy Edwards           Date of Birth: Jun 09, 1955           MRN: [id number] Visit Date: 01/01/2021              Requested by: Tonia Ghent, MD [address] [place],  [place] [address] PCP: Tonia Ghent, MD   Assessment & Plan: Visit Diagnoses:  1. Acute pain of left knee   2. Adhesive capsulitis of left shoulder   3. Closed olecranon fracture, left, initial encounter     Plan: [name] comes in today for 3 separate issues.  In regards to the olecranon fracture she is interested in having the hardware eventually removed as it is fairly prominent underneath the skin.  No complaints otherwise.  For the adhesive capsulitis she has responded very well to physical therapy and cortisone injection.  She will continue to do physical therapy for the shoulder.  New issue today is acute left knee pain due to injury 2 weeks ago.  She pivoted and felt immediate sharp stabbing pain on the lateral side.  Feels a little bit of swelling along the anterior lateral aspect of the joint line.  She has pain with walking and using stairs.  Aleve does not help.  Not describing any significant mechanical symptoms.  Left elbow exam shows fully healed surgical scar.  There is prominence of the hardware underneath the skin.  There is no skin compromise. Left shoulder shows significant improvement in range of motion in flexion abduction external rotation internal rotation. Left knee shows no joint effusion.  She is quite tender along the lateral joint line with McMurray testing.  Collaterals and cruciates are stable.  In regards to the elbow and the shoulder she will let me know if she wants hardware taken out once it has been a year from the surgery.  For the shoulder she will continue physical therapy.  She does not need another cortisone shot today.  For the left knee impression is meniscus tear.  Not really having mechanical symptoms.  I recommended a cortisone injection  followed by 6 weeks of relative rest.  She will contact me if she continues to be symptomatic at which point we would obtain an MRI.  Otherwise we will see her back as needed.  Follow-Up Instructions: No follow-ups on file.   Orders:  Orders Placed This Encounter  Procedures   XR KNEE 3 VIEW LEFT   No orders of the defined types were placed in this encounter.     Procedures: Large Joint Inj: L knee on 01/01/2021 9:35 AM Details: 22 G needle Medications: 2 mL bupivacaine 0.5 %; 2 mL lidocaine 1 %; 40 mg methylPREDNISolone acetate 40 MG/ML Outcome: tolerated well, no immediate complications Patient was prepped and draped in the usual sterile fashion.      Clinical Data: No additional findings.   Subjective: Chief Complaint  Patient presents with   Left Elbow - Follow-up    ORIF left olecranon fracture 08/23/2020   Left Knee - Pain    HPI  Review of Systems   Objective: Vital Signs: There were no vitals taken for this visit.  Physical Exam  Ortho Exam  Specialty Comments:  No specialty comments available.  Imaging: XR KNEE 3 VIEW LEFT  Result Date: 01/01/2021 Mild joint space narrowing.  No acute abnormalities.  No degenerative changes.    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   History of nonmelanoma skin cancer 05/10/2017   Positional vertigo of left ear 04/20/2016   Vitamin D deficiency 03/29/2014   Routine general medical examination at a health care facility 06/30/2012   Insomnia secondary to depression with anxiety 06/30/2012   Post-menopausal osteoporosis    Past Medical History:  Diagnosis Date   Depression    Osteoporosis    Skin cancer     Family History   Problem Relation Age of Onset   Anemia Mother    Alcohol abuse Father    Alcohol abuse Sister    Colon cancer Neg Hx    Breast cancer Neg Hx     Past Surgical History:  Procedure Laterality Date   APPENDECTOMY     ORIF ELBOW FRACTURE Left 08/23/2020   Procedure: OPEN REDUCTION INTERNAL FIXATION (ORIF) LEFT ELBOW/OLECRANON FRACTURE;  Surgeon: Leandrew Koyanagi, MD;  Location: [facility];  Service: Orthopedics;  Laterality: Left;   Social History   Occupational History   Not on file  Tobacco Use   Smoking status: Never   Smokeless tobacco: Never  Vaping Use   Vaping Use: Never used  Substance and Sexual Activity   Alcohol use: Not Currently    Comment: wine, 1 a day   Drug use: No   Sexual activity: Not on file

## 2021-01-02 ENCOUNTER — Encounter: Payer: PPO | Admitting: Physical Therapy

## 2021-01-03 IMAGING — DX DG HIP (WITH OR WITHOUT PELVIS) 2-3V*R*
3 series · 3 of 3 positions shown · non-contrast
Comparison: None.

CLINICAL DATA: Right pelvic pain for 2 months

EXAM:
DG HIP (WITH OR WITHOUT PELVIS) 3V RIGHT

[pelvis ap]
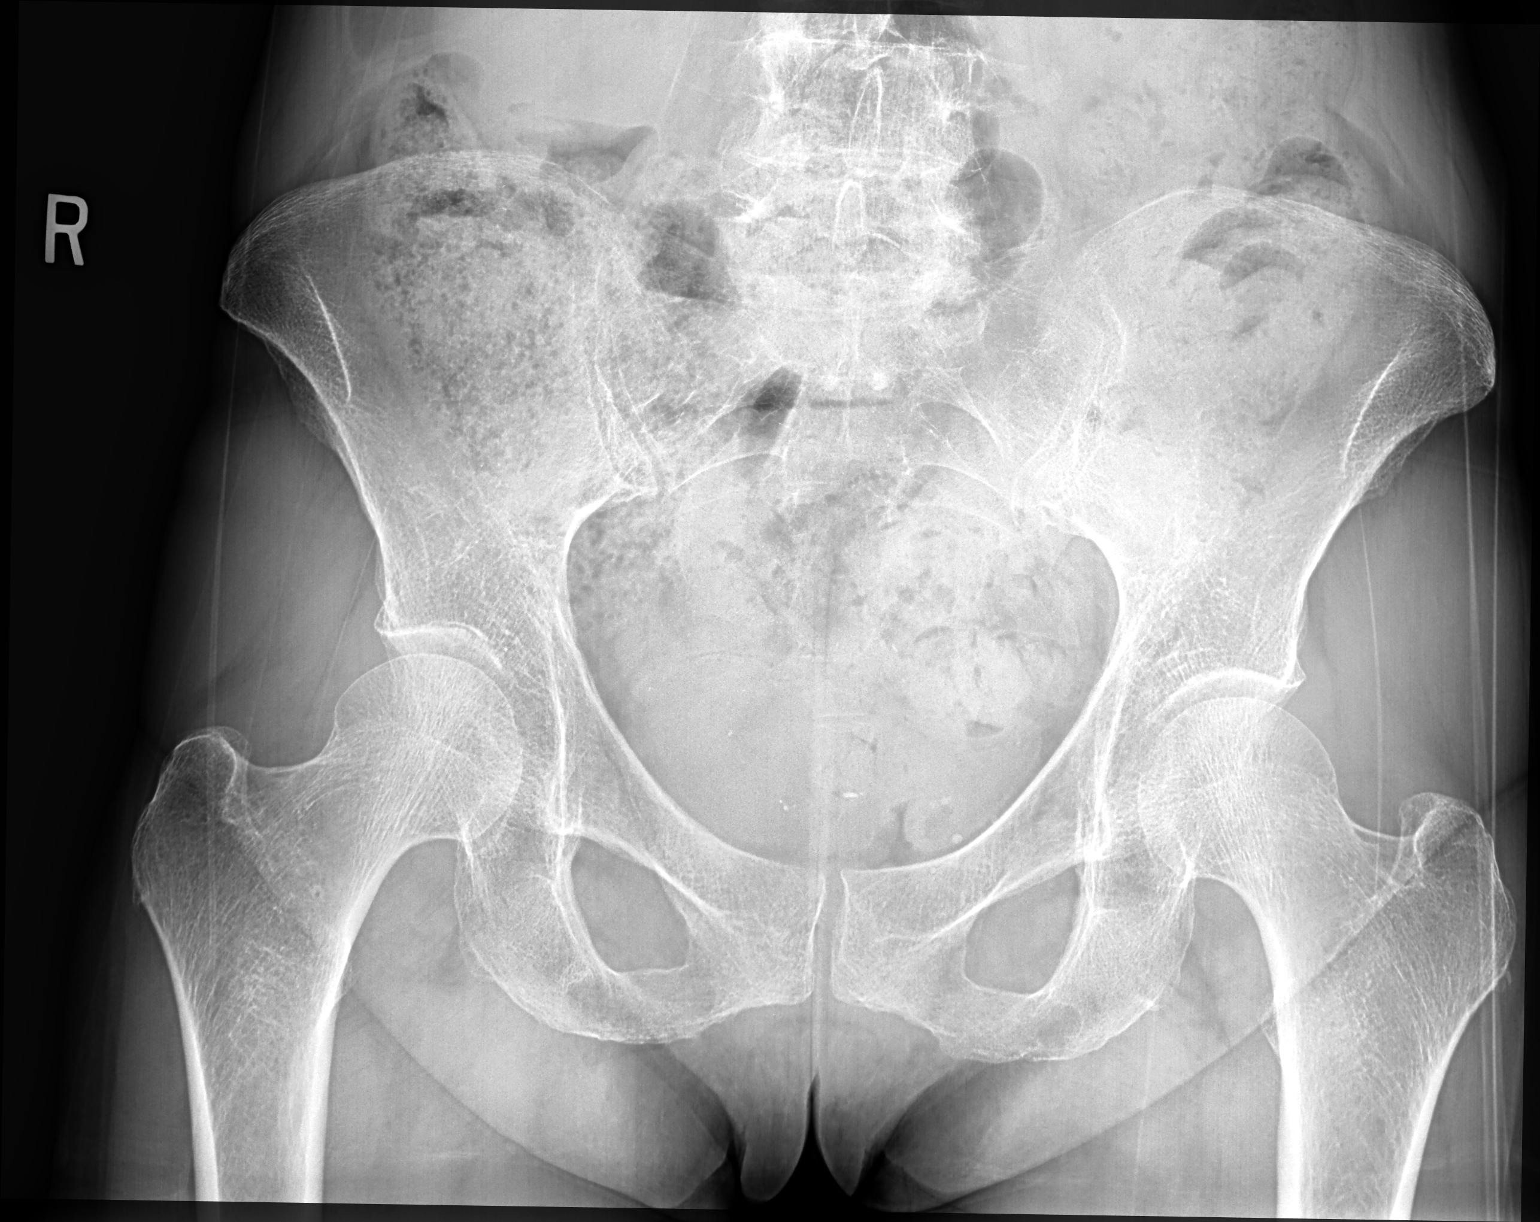

[hip joint ap]
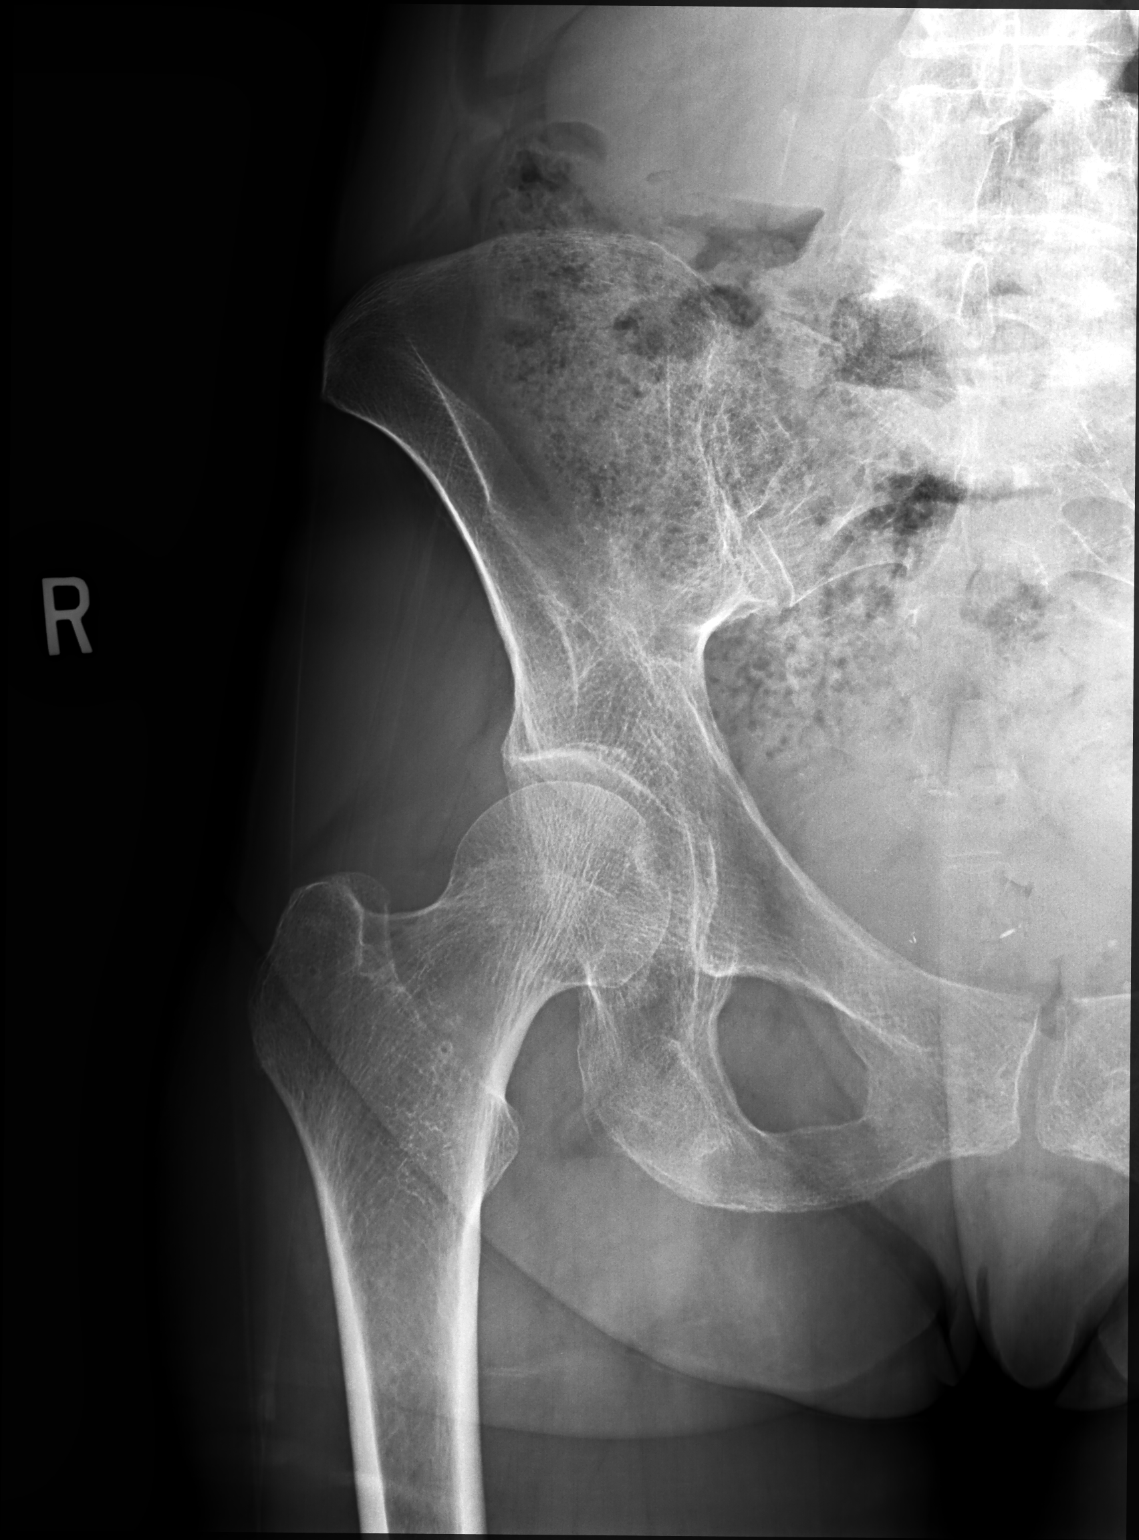

[ap frog obl (oblique)]
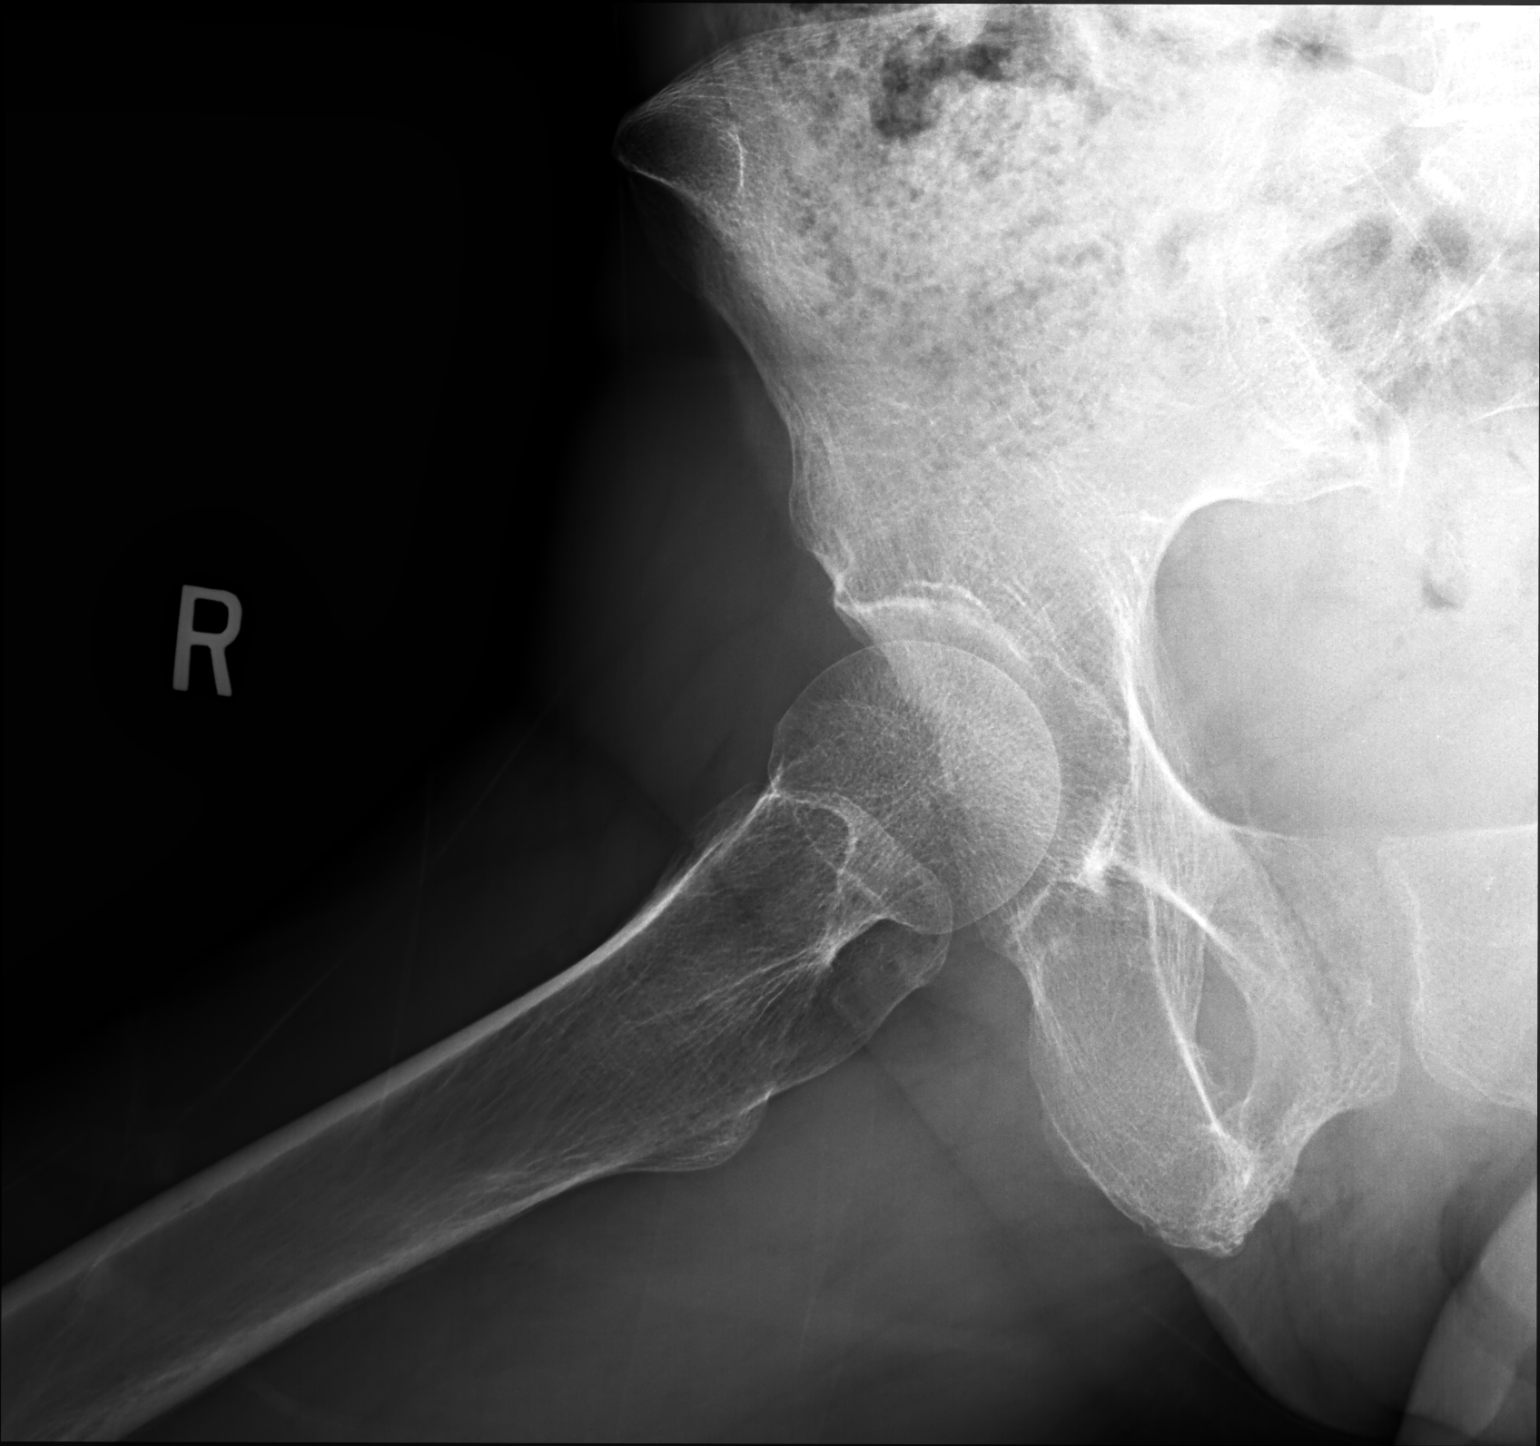

[3 of 3 positions shown; findings below may reference images not displayed]

FINDINGS: There is no evidence of hip fracture or dislocation. There is no
evidence of arthropathy or other focal bone abnormality.
IMPRESSION: No acute abnormality noted.

## 2021-01-14 ENCOUNTER — Encounter: Payer: PPO | Admitting: Physical Therapy

## 2021-02-07 DIAGNOSIS — D485 Neoplasm of uncertain behavior of skin: Secondary | ICD-10-CM | POA: Diagnosis not present

## 2021-02-07 DIAGNOSIS — L858 Other specified epidermal thickening: Secondary | ICD-10-CM | POA: Diagnosis not present

## 2021-02-07 DIAGNOSIS — L308 Other specified dermatitis: Secondary | ICD-10-CM | POA: Diagnosis not present

## 2021-03-04 ENCOUNTER — Ambulatory Visit: Payer: PPO

## 2021-03-06 DIAGNOSIS — L905 Scar conditions and fibrosis of skin: Secondary | ICD-10-CM | POA: Diagnosis not present

## 2021-03-06 DIAGNOSIS — C44729 Squamous cell carcinoma of skin of left lower limb, including hip: Secondary | ICD-10-CM | POA: Diagnosis not present

## 2021-03-10 NOTE — Progress Notes (Deleted)
Subjective:   Joy Edwards is a 66 y.o. female who presents for Medicare Annual (Subsequent) preventive examination.  I connected with Joy Edwards today by telephone and verified that I am speaking with the correct person using two identifiers. Location patient: home Location provider: work Persons participating in the virtual visit: patient, Marine scientist.    I discussed the limitations, risks, security and privacy concerns of performing an evaluation and management service by telephone and the availability of in person appointments. I also discussed with the patient that there may be a patient responsible charge related to this service. The patient expressed understanding and verbally consented to this telephonic visit.    Interactive audio and video telecommunications were attempted between this provider and patient, however failed, due to patient having technical difficulties OR patient did not have access to video capability.  We continued and completed visit with audio only.  Some vital signs may be absent or patient reported.   Time Spent with patient on telephone encounter: *** minutes  Review of Systems           Objective:    There were no vitals filed for this visit. There is no height or weight on file to calculate BMI.  Advanced Directives 10/29/2020 08/19/2020 09/28/2019 06/21/2019  Does Patient Have a Medical Advance Directive? Yes Yes Yes Yes  Type of Paramedic of [place];Living will;Out of facility DNR (pink MOST or yellow form) [place];Living will [place];Living will [place];Living will  Does patient want to make changes to medical advance directive? No - Patient declined No - Patient declined No - Patient declined No - Patient declined  Copy of [place] in Chart? No - copy requested No - copy requested - No - copy requested    Current Medications  (verified) Outpatient Encounter Medications as of 03/11/2021  Medication Sig   alendronate (FOSAMAX) 70 MG tablet Take 1 tablet (70 mg total) by mouth every 7 (seven) days. Take with a full glass of water on an empty stomach.   buPROPion (WELLBUTRIN XL) 300 MG 24 hr tablet Take 300 mg by mouth daily.   Cholecalciferol (VITAMIN D3) 25 MCG (1000 UT) CAPS Take 1 capsule (1,000 Units total) by mouth daily.   clobetasol ointment (TEMOVATE) 0.05 %    clotrimazole-betamethasone (LOTRISONE) cream SMARTSIG:1 Topical Every Night   sertraline (ZOLOFT) 25 MG tablet Take 25 mg by mouth daily.   zinc sulfate 220 (50 Zn) MG capsule Take 1 capsule (220 mg total) by mouth daily.   No facility-administered encounter medications on file as of 03/11/2021.    Allergies (verified) Latex   History: Past Medical History:  Diagnosis Date   Depression    Osteoporosis    Skin cancer    Past Surgical History:  Procedure Laterality Date   APPENDECTOMY     ORIF ELBOW FRACTURE Left 08/23/2020   Procedure: OPEN REDUCTION INTERNAL FIXATION (ORIF) LEFT ELBOW/OLECRANON FRACTURE;  Surgeon: Leandrew Koyanagi, MD;  Location: [facility];  Service: Orthopedics;  Laterality: Left;   Family History  Problem Relation Age of Onset   Anemia Mother    Alcohol abuse Father    Alcohol abuse Sister    Colon cancer Neg Hx    Breast cancer Neg Hx    Social History   Socioeconomic History   Marital status: Married    Spouse name: Not on file   Number of children: Not on  file   Years of education: Not on file   Highest education level: Not on file  Occupational History   Not on file  Tobacco Use   Smoking status: Never   Smokeless tobacco: Never  Vaping Use   Vaping Use: Never used  Substance and Sexual Activity   Alcohol use: Not Currently    Comment: wine, 1 a day   Drug use: No   Sexual activity: Not on file  Other Topics Concern   Not on file  Social History Narrative   Married 1977   Has  chickens, goats, horses and 100 acres.     2 daughters   2 grandkids.     Cares for her elderly mother.     Social Determinants of Health   Financial Resource Strain: Not on file  Food Insecurity: Not on file  Transportation Needs: Not on file  Physical Activity: Not on file  Stress: Not on file  Social Connections: Not on file    Tobacco Counseling Counseling given: Not Answered   Clinical Intake:                 Diabetic? No         Activities of Daily Living In your present state of health, do you have any difficulty performing the following activities: 08/23/2020  Hearing? N  Vision? N  Difficulty concentrating or making decisions? N  Walking or climbing stairs? N  Dressing or bathing? Y  Some recent data might be hidden    Patient Care Team: Joy Ghent, MD as PCP - General (Family Medicine)  Indicate any recent Medical Services you may have received from other than [facility] providers in the past year (date may be approximate).     Assessment:   This is a routine wellness examination for Joy Edwards.  Hearing/Vision screen No results found.  Dietary issues and exercise activities discussed:     Goals Addressed   None    Depression Screen PHQ 2/9 Scores 12/12/2020 06/21/2019  PHQ - 2 Score 0 0  PHQ- 9 Score 1 -    Fall Risk Fall Risk  07/25/2020 07/24/2019 06/21/2019 11/14/2018 11/07/2018  Falls in the past year? 0 0 0 0 0  Comment - - - - -  Number falls in past yr: 0 - - - -  Comment - - - - -  Injury with Fall? 0 - - - -  Risk for fall due to : No Fall Risks - - - -  Follow up Falls evaluation completed Falls evaluation completed Falls evaluation completed Falls evaluation completed -    FALL RISK PREVENTION PERTAINING TO THE HOME:  Any stairs in or around the home? {YES/NO:21197} If so, are there any without handrails? {YES/NO:21197} Home free of loose throw rugs in walkways, pet beds, electrical cords, etc?  {YES/NO:21197} Adequate lighting in your home to reduce risk of falls? {YES/NO:21197}  ASSISTIVE DEVICES UTILIZED TO PREVENT FALLS:  Life alert? {YES/NO:21197} Use of a cane, walker or w/c? {YES/NO:21197} Grab bars in the bathroom? {YES/NO:21197} Shower chair or bench in shower? {YES/NO:21197} Elevated toilet seat or a handicapped toilet? {YES/NO:21197}  TIMED UP AND GO:  Was the test performed? No .    Cognitive Function: MMSE - Mini Mental State Exam 06/21/2019  Not completed: Unable to complete        Immunizations Immunization History  Administered Date(s) Administered   Fluad Quad(high Dose 65+) 12/06/2018   Influenza Split 10/27/2010   Influenza,inj,Quad PF,6+ Mos  03/27/2014   Influenza-Unspecified 12/09/2012, 09/07/2019, 10/30/2020   PFIZER(Purple Top)SARS-COV-2 Vaccination 02/21/2019, 03/11/2019, 10/30/2020   Pneumococcal Conjugate-13 07/25/2020   Tdap 03/27/2014, 11/17/2019   Zoster Recombinat (Shingrix) 04/19/2017, 07/14/2017    TDAP status: Up to date  Flu Vaccine status: Up to date  Pneumococcal vaccine status: Up to date  {Covid-19 vaccine status:2101808}  Qualifies for Shingles Vaccine? Yes   Zostavax completed No   Shingrix Completed?: Yes  Screening Tests Health Maintenance  Topic Date Due   COVID-19 Vaccine (4 - Booster for Pfizer series) 12/25/2020   Pneumonia Vaccine 60+ Years old (2 - PPSV23 if available, else PCV20) 07/25/2021   MAMMOGRAM  04/30/2022   COLONOSCOPY (Pts 45-70yrs Insurance coverage will need to be confirmed)  07/29/2027   TETANUS/TDAP  11/16/2029   INFLUENZA VACCINE  Completed   DEXA SCAN  Completed   Hepatitis C Screening  Completed   Zoster Vaccines- Shingrix  Completed   HPV VACCINES  Aged Out    Health Maintenance  Health Maintenance Due  Topic Date Due   COVID-19 Vaccine (4 - Booster for [place] series) 12/25/2020    Colorectal cancer screening: Type of screening: Colonoscopy. Completed 07/28/17. Repeat every  10 years  {Mammogram status:21018020}  Bone Density status: Completed 11/12/20. Results reflect: Bone density results: OSTEOPOROSIS. Repeat every 2 years.  Lung Cancer Screening: (Low Dose CT Chest recommended if Age 44-80 years, 30 pack-year currently smoking OR have quit w/in 15years.) does not qualify.     Additional Screening:  Hepatitis C Screening: does qualify; Completed 03/07/14  Vision Screening: Recommended annual ophthalmology exams for early detection of glaucoma and other disorders of the eye. Is the patient up to date with their annual eye exam?  {YES/NO:21197} Who is the provider or what is the name of the office in which the patient attends annual eye exams? *** If pt is not established with a provider, would they like to be referred to a provider to establish care? {YES/NO:21197}.   Dental Screening: Recommended annual dental exams for proper oral hygiene  Community Resource Referral / Chronic Care Management: CRR required this visit?  {YES/NO:21197}  CCM required this visit?  {YES/NO:21197}     Plan:     I have personally reviewed and noted the following in the patients chart:   Medical and social history Use of alcohol, tobacco or illicit drugs  Current medications and supplements including opioid prescriptions.  Functional ability and status Nutritional status Physical activity Advanced directives List of other physicians Hospitalizations, surgeries, and ER visits in previous 12 months Vitals Screenings to include cognitive, depression, and falls Referrals and appointments  In addition, I have reviewed and discussed with patient certain preventive protocols, quality metrics, and best practice recommendations. A written personalized care plan for preventive services as well as general preventive health recommendations were provided to patient.   Due to this being a telephonic visit, the after visit summary with patients personalized plan was offered  to patient via mail or my-chart. ***Patient declined at this time./ Patient would like to access on my-chart/ per request, patient was mailed a copy of AVS./ Patient preferred to pick up at office at next visit.   Loma Messing, LPN   09/10/3808   Nurse Health Advisor  Nurse Notes: none

## 2021-03-11 ENCOUNTER — Ambulatory Visit: Payer: PPO

## 2021-04-18 DIAGNOSIS — D225 Melanocytic nevi of trunk: Secondary | ICD-10-CM | POA: Diagnosis not present

## 2021-04-18 DIAGNOSIS — Z85828 Personal history of other malignant neoplasm of skin: Secondary | ICD-10-CM | POA: Diagnosis not present

## 2021-04-18 DIAGNOSIS — X32XXXA Exposure to sunlight, initial encounter: Secondary | ICD-10-CM | POA: Diagnosis not present

## 2021-04-18 DIAGNOSIS — D2271 Melanocytic nevi of right lower limb, including hip: Secondary | ICD-10-CM | POA: Diagnosis not present

## 2021-04-18 DIAGNOSIS — L570 Actinic keratosis: Secondary | ICD-10-CM | POA: Diagnosis not present

## 2021-04-18 DIAGNOSIS — D2261 Melanocytic nevi of right upper limb, including shoulder: Secondary | ICD-10-CM | POA: Diagnosis not present

## 2021-04-18 DIAGNOSIS — L821 Other seborrheic keratosis: Secondary | ICD-10-CM | POA: Diagnosis not present

## 2021-04-18 DIAGNOSIS — D2262 Melanocytic nevi of left upper limb, including shoulder: Secondary | ICD-10-CM | POA: Diagnosis not present

## 2021-04-22 ENCOUNTER — Telehealth: Payer: Self-pay | Admitting: Family Medicine

## 2021-04-22 DIAGNOSIS — M810 Age-related osteoporosis without current pathological fracture: Secondary | ICD-10-CM

## 2021-04-22 NOTE — Telephone Encounter (Signed)
Reordered under Dr. Damita Dunnings.  ?

## 2021-04-22 NOTE — Telephone Encounter (Signed)
Joy Edwards, from [facility] is requesting a bone density referral for tomorrow, 04/22/2021. Fax # [phone]. ?

## 2021-04-22 NOTE — Telephone Encounter (Signed)
Printed and placed in quick sign folder 

## 2021-04-23 NOTE — Telephone Encounter (Signed)
Thank you for working on this.  Please let me know if there is an issue that I still need to address with the order. ?

## 2021-04-28 DIAGNOSIS — Z1231 Encounter for screening mammogram for malignant neoplasm of breast: Secondary | ICD-10-CM | POA: Diagnosis not present

## 2021-04-28 LAB — HM MAMMOGRAPHY

## 2021-05-15 ENCOUNTER — Telehealth (INDEPENDENT_AMBULATORY_CARE_PROVIDER_SITE_OTHER): Payer: PPO | Admitting: Family Medicine

## 2021-05-15 ENCOUNTER — Encounter: Payer: Self-pay | Admitting: Family Medicine

## 2021-05-15 VITALS — BP 126/78 | Wt 115.0 lb

## 2021-05-15 DIAGNOSIS — U071 COVID-19: Secondary | ICD-10-CM

## 2021-05-15 DIAGNOSIS — S32010A Wedge compression fracture of first lumbar vertebra, initial encounter for closed fracture: Secondary | ICD-10-CM | POA: Diagnosis not present

## 2021-05-15 MED ORDER — NIRMATRELVIR/RITONAVIR (PAXLOVID)TABLET: 3.0000 | ORAL_TABLET | Freq: Two times a day (BID) | ORAL | 0 refills | Status: AC

## 2021-05-15 NOTE — Progress Notes (Addendum)
Virtual Visit via Video Note ? ?I connected with pt on 05/15/21 at  1:20 PM EDT by a video enabled telemedicine application and verified that I am speaking with the correct person using two identifiers. ? Location patient: [place] ?Location provider:work or home office ?Persons participating in the virtual visit: patient, provider ? ?I discussed the limitations and requested verbal permission for telemedicine visit. The patient expressed understanding and agreed to proceed. ? ?HPI: ?66 y/o female being seen today for "covid positive". ?Joy Edwards has been feeling some nasal congestion and runny nose for about a week now which she attributed to her seasonal allergies.  Then yesterday she developed a significant cough and general malaise and achiness.  She had a home COVID test and it was positive today. ?No fever, no shortness of breath, no wheezing, no chest pain, no nausea vomiting or diarrhea. ?Her mother lives in a long-term care facility and also tested positive. ? ?Allergies  ?Allergen Reactions  ? Latex   ? ?-COVID-19 vaccine status:  ?Immunization History  ?Administered Date(s) Administered  ? Fluad Quad(high Dose 65+) 12/06/2018  ? Influenza Split 10/27/2010  ? Influenza,inj,Quad PF,6+ Mos 03/27/2014  ? Influenza-Unspecified 12/09/2012, 09/07/2019, 10/30/2020  ? PFIZER(Purple Top)SARS-COV-2 Vaccination 02/21/2019, 03/11/2019, 10/30/2020  ? Pneumococcal Conjugate-13 07/25/2020  ? Tdap 03/27/2014, 11/17/2019  ? Zoster Recombinat (Shingrix) 04/19/2017, 07/14/2017  ? ?ROS: See pertinent positives and negatives per HPI. ? ?Past Medical History:  ?Diagnosis Date  ? Depression   ? Osteoporosis   ? Skin cancer   ? ? ?Past Surgical History:  ?Procedure Laterality Date  ? APPENDECTOMY    ? ORIF ELBOW FRACTURE Left 08/23/2020  ? Procedure: OPEN REDUCTION INTERNAL FIXATION (ORIF) LEFT ELBOW/OLECRANON FRACTURE;  Surgeon: Leandrew Koyanagi, MD;  Location: [place];  Service: Orthopedics;  Laterality: Left;   ? ? ? ?Current Outpatient Medications:  ?  alendronate (FOSAMAX) 70 MG tablet, Take 1 tablet (70 mg total) by mouth every 7 (seven) days. Take with a full glass of water on an empty stomach., Disp: 13 tablet, Rfl: 3 ?  buPROPion (WELLBUTRIN XL) 300 MG 24 hr tablet, Take 300 mg by mouth daily., Disp: , Rfl:  ?  Cholecalciferol (VITAMIN D3) 25 MCG (1000 UT) CAPS, Take 1 capsule (1,000 Units total) by mouth daily., Disp: , Rfl:  ?  clobetasol ointment (TEMOVATE) 0.05 %, , Disp: , Rfl:  ?  clotrimazole-betamethasone (LOTRISONE) cream, SMARTSIG:1 Topical Every Night, Disp: , Rfl:  ?  sertraline (ZOLOFT) 25 MG tablet, Take 25 mg by mouth daily., Disp: , Rfl:  ?  zinc sulfate 220 (50 Zn) MG capsule, Take 1 capsule (220 mg total) by mouth daily., Disp: 42 capsule, Rfl: 0 ? ?EXAM: ? ?VITALS per patient if applicable:  ? ?  8/33/8250  ?  1:21 PM 12/12/2020  ?  4:10 PM 10/08/2020  ?  1:07 PM  ?Vitals with BMI  ?Height  '5\' 2"'$  '5\' 2"'$   ?Weight 115 lbs 115 lbs 117 lbs  ?BMI  21.03 21.39  ?Systolic 539    ?Diastolic 78    ? ? ?GENERAL: alert, oriented, appears well and in no acute distress ? ?HEENT: atraumatic, conjunttiva clear, no obvious abnormalities on inspection of external nose and ears ? ?NECK: normal movements of the head and neck ? ?LUNGS: on inspection no signs of respiratory distress, breathing rate appears normal, no obvious gross SOB, gasping or wheezing ? ?CV: no obvious cyanosis ? ?MS: moves all visible extremities without noticeable abnormality ? ?PSYCH/NEURO: pleasant  and cooperative, no obvious depression or anxiety, speech and thought processing grossly intact ? ?LABS: none today ? ?  Chemistry   ?   ?Component Value Date/Time  ? NA 138 07/25/2020 1059  ? K 4.3 07/25/2020 1059  ? CL 102 07/25/2020 1059  ? CO2 28 07/25/2020 1059  ? BUN 20 07/25/2020 1059  ? CREATININE 0.72 07/25/2020 1059  ?    ?Component Value Date/Time  ? CALCIUM 9.7 07/25/2020 1059  ? ALKPHOS 60 07/25/2020 1059  ? AST 23 07/25/2020 1059  ? ALT  23 07/25/2020 1059  ? BILITOT 0.4 07/25/2020 1059  ?  ? ?ASSESSMENT AND PLAN: ? ?Discussed the following assessment and plan: ? ?COVID-19 respiratory illness. ?Mild to moderate symptoms. ?Patient's risk factors for complications from COVID are: Age>65. ?GFR 86 back in June 2022.  No hx of acute or chronic renal insufficiency. ?Paxlovid prescribed. ? ?I discussed the assessment and treatment plan with the patient. The patient was provided an opportunity to ask questions and all were answered. The patient agreed with the plan and demonstrated an understanding of the instructions. ?  ?F/u:  if not signif improving in 4-5d ? ?Signed:  Crissie Sickles, MD           05/15/2021 ? ?

## 2021-05-29 DIAGNOSIS — S32010A Wedge compression fracture of first lumbar vertebra, initial encounter for closed fracture: Secondary | ICD-10-CM | POA: Diagnosis not present

## 2021-06-10 NOTE — Patient Instructions (Addendum)
[name] , ?Thank you for taking time to come for your Medicare Wellness Visit. I appreciate your ongoing commitment to your health goals. Please review the following plan we discussed and let me know if I can assist you in the future.  ? ?Screening recommendations/referrals: ?Colonoscopy: Done 07/28/2017 ?Mammogram: Done 04/28/2021 Repeat annually ? ?Bone Density: Done 11/12/2020 Repeat every 2 years ? ?Recommended yearly ophthalmology/optometry visit for glaucoma screening and checkup ?Recommended yearly dental visit for hygiene and checkup ? ?Vaccinations: ?Influenza vaccine: Done 10/30/2020 Repeat annually ? ?Pneumococcal vaccine: Done 07/25/2020 ?Tdap vaccine: Done 11/17/2019 Repeat in 10 years ? ?Shingles vaccine: Done 07/14/2017 and 04/19/2017   ?Covid-19:Done 02/21/2019, 03/11/2019 and 10/30/2020 ? ?Advanced directives: Please bring a copy of your health care power of attorney and living will to the office to be added to your chart at your convenience. ? ? ?Conditions/risks identified: KEEP UP THE WORK!! ? ? ?Next appointment: Follow up in one year for your annual wellness visit 2024.  ? ? ?Preventive Care 68 Years and Older, Female ?Preventive care refers to lifestyle choices and visits with your health care provider that can promote health and wellness. ?What does preventive care include? ?A yearly physical exam. This is also called an annual well check. ?Dental exams once or twice a year. ?Routine eye exams. Ask your health care provider how often you should have your eyes checked. ?Personal lifestyle choices, including: ?Daily care of your teeth and gums. ?Regular physical activity. ?Eating a healthy diet. ?Avoiding tobacco and drug use. ?Limiting alcohol use. ?Practicing safe sex. ?Taking low-dose aspirin every day. ?Taking vitamin and mineral supplements as recommended by your health care provider. ?What happens during an annual well check? ?The services and screenings done by your health care provider during  your annual well check will depend on your age, overall health, lifestyle risk factors, and family history of disease. ?Counseling  ?Your health care provider may ask you questions about your: ?Alcohol use. ?Tobacco use. ?Drug use. ?Emotional well-being. ?Home and relationship well-being. ?Sexual activity. ?Eating habits. ?History of falls. ?Memory and ability to understand (cognition). ?Work and work Statistician. ?Reproductive health. ?Screening  ?You may have the following tests or measurements: ?Height, weight, and BMI. ?Blood pressure. ?Lipid and cholesterol levels. These may be checked every 5 years, or more frequently if you are over 55 years old. ?Skin check. ?Lung cancer screening. You may have this screening every year starting at age 55 if you have a 30-pack-year history of smoking and currently smoke or have quit within the past 15 years. ?Fecal occult blood test (FOBT) of the stool. You may have this test every year starting at age 74. ?Flexible sigmoidoscopy or colonoscopy. You may have a sigmoidoscopy every 5 years or a colonoscopy every 10 years starting at age 36. ?Hepatitis C blood test. ?Hepatitis B blood test. ?Sexually transmitted disease (STD) testing. ?Diabetes screening. This is done by checking your blood sugar (glucose) after you have not eaten for a while (fasting). You may have this done every 1-3 years. ?Bone density scan. This is done to screen for osteoporosis. You may have this done starting at age 73. ?Mammogram. This may be done every 1-2 years. Talk to your health care provider about how often you should have regular mammograms. ?Talk with your health care provider about your test results, treatment options, and if necessary, the need for more tests. ?Vaccines  ?Your health care provider may recommend certain vaccines, such as: ?Influenza vaccine. This is recommended every year. ?Tetanus,  diphtheria, and acellular pertussis (Tdap, Td) vaccine. You may need a Td booster every 10  years. ?Zoster vaccine. You may need this after age 88. ?Pneumococcal 13-valent conjugate (PCV13) vaccine. One dose is recommended after age 30. ?Pneumococcal polysaccharide (PPSV23) vaccine. One dose is recommended after age 40. ?Talk to your health care provider about which screenings and vaccines you need and how often you need them. ?This information is not intended to replace advice given to you by your health care provider. Make sure you discuss any questions you have with your health care provider. ?Document Released: 02/15/2015 Document Revised: 10/09/2015 Document Reviewed: 11/20/2014 ?Elsevier Interactive Patient Education ? 2017 [place]. ? ?Fall Prevention in the Home ?Falls can cause injuries. They can happen to people of all ages. There are many things you can do to make your home safe and to help prevent falls. ?What can I do on the outside of my home? ?Regularly fix the edges of walkways and driveways and fix any cracks. ?Remove anything that might make you trip as you walk through a door, such as a raised step or threshold. ?Trim any bushes or trees on the path to your home. ?Use bright outdoor lighting. ?Clear any walking paths of anything that might make someone trip, such as rocks or tools. ?Regularly check to see if handrails are loose or broken. Make sure that both sides of any steps have handrails. ?Any raised decks and porches should have guardrails on the edges. ?Have any leaves, snow, or ice cleared regularly. ?Use sand or salt on walking paths during winter. ?Clean up any spills in your garage right away. This includes oil or grease spills. ?What can I do in the bathroom? ?Use night lights. ?Install grab bars by the toilet and in the tub and shower. Do not use towel bars as grab bars. ?Use non-skid mats or decals in the tub or shower. ?If you need to sit down in the shower, use a plastic, non-slip stool. ?Keep the floor dry. Clean up any water that spills on the floor as soon as it  happens. ?Remove soap buildup in the tub or shower regularly. ?Attach bath mats securely with double-sided non-slip rug tape. ?Do not have throw rugs and other things on the floor that can make you trip. ?What can I do in the bedroom? ?Use night lights. ?Make sure that you have a light by your bed that is easy to reach. ?Do not use any sheets or blankets that are too big for your bed. They should not hang down onto the floor. ?Have a firm chair that has side arms. You can use this for support while you get dressed. ?Do not have throw rugs and other things on the floor that can make you trip. ?What can I do in the kitchen? ?Clean up any spills right away. ?Avoid walking on wet floors. ?Keep items that you use a lot in easy-to-reach places. ?If you need to reach something above you, use a strong step stool that has a grab bar. ?Keep electrical cords out of the way. ?Do not use floor polish or wax that makes floors slippery. If you must use wax, use non-skid floor wax. ?Do not have throw rugs and other things on the floor that can make you trip. ?What can I do with my stairs? ?Do not leave any items on the stairs. ?Make sure that there are handrails on both sides of the stairs and use them. Fix handrails that are broken or loose. Make  sure that handrails are as long as the stairways. ?Check any carpeting to make sure that it is firmly attached to the stairs. Fix any carpet that is loose or worn. ?Avoid having throw rugs at the top or bottom of the stairs. If you do have throw rugs, attach them to the floor with carpet tape. ?Make sure that you have a light switch at the top of the stairs and the bottom of the stairs. If you do not have them, ask someone to add them for you. ?What else can I do to help prevent falls? ?Wear shoes that: ?Do not have high heels. ?Have rubber bottoms. ?Are comfortable and fit you well. ?Are closed at the toe. Do not wear sandals. ?If you use a stepladder: ?Make sure that it is fully opened.  Do not climb a closed stepladder. ?Make sure that both sides of the stepladder are locked into place. ?Ask someone to hold it for you, if possible. ?Clearly mark and make sure that you can see: ?Any grab ba

## 2021-06-11 ENCOUNTER — Ambulatory Visit (INDEPENDENT_AMBULATORY_CARE_PROVIDER_SITE_OTHER): Payer: PPO

## 2021-06-11 VITALS — Ht 62.0 in | Wt 117.0 lb

## 2021-06-11 DIAGNOSIS — Z0000 Encounter for general adult medical examination without abnormal findings: Secondary | ICD-10-CM

## 2021-06-11 NOTE — Progress Notes (Signed)
? ?Subjective:  ? Joy Edwards is a 66 y.o. female who presents for Medicare Annual (Subsequent) preventive examination. ?Virtual Visit via Telephone Note ? ?I connected with  Joy Edwards on 06/11/21 at 10:30 AM EDT by telephone and verified that I am speaking with the correct person using two identifiers. ? ?Location: ?Patient: HOME ?Provider: Eustace Pen ?Persons participating in the virtual visit: patient/Nurse Health Advisor ?  ?I discussed the limitations, risks, security and privacy concerns of performing an evaluation and management service by telephone and the availability of in person appointments. The patient expressed understanding and agreed to proceed. ? ?Interactive audio and video telecommunications were attempted between this nurse and patient, however failed, due to patient having technical difficulties OR patient did not have access to video capability.  We continued and completed visit with audio only. ? ?Some vital signs may be absent or patient reported.  ? ?Chriss Driver, LPN ? ?Review of Systems    ? ?Cardiac Risk Factors include: advanced age (>14mn, >>61women);Other (see comment), Risk factor comments: Osteoporosis, Vertigo ? ?   ?Objective:  ?  ?Today's Vitals  ? 06/11/21 1024  ?Weight: 117 lb (53.1 kg)  ?Height: '5\' 2"'$  (1.575 m)  ? ?Body mass index is 21.4 kg/m?. ? ? ?  06/11/2021  ? 10:31 AM 10/29/2020  ?  9:42 AM 08/19/2020  ?  9:55 AM 09/28/2019  ?  8:57 AM 06/21/2019  ? 10:42 AM  ?Advanced Directives  ?Does Patient Have a Medical Advance Directive? Yes Yes Yes Yes Yes  ?Type of AParamedicof [place]Living will [place]Living will;Out of facility DNR (pink MOST or yellow form) [place]Living will [place]Living will [place]Living will  ?Does patient want to make changes to medical advance directive?  No - Patient declined No - Patient declined No - Patient  declined No - Patient declined  ?Copy of [place] Chart? No - copy requested No - copy requested No - copy requested  No - copy requested  ? ? ?Current Medications (verified) ?Outpatient Encounter Medications as of 06/11/2021  ?Medication Sig  ? alendronate (FOSAMAX) 70 MG tablet Take 1 tablet (70 mg total) by mouth every 7 (seven) days. Take with a full glass of water on an empty stomach.  ? buPROPion (WELLBUTRIN XL) 300 MG 24 hr tablet Take 300 mg by mouth daily.  ? Cholecalciferol (VITAMIN D3) 25 MCG (1000 UT) CAPS Take 1 capsule (1,000 Units total) by mouth daily.  ? clobetasol ointment (TEMOVATE) 0.05 %   ? clotrimazole-betamethasone (LOTRISONE) cream SMARTSIG:1 Topical Every Night  ? sertraline (ZOLOFT) 50 MG tablet Take 50 mg by mouth daily.  ? zinc sulfate 220 (50 Zn) MG capsule Take 1 capsule (220 mg total) by mouth daily.  ? [DISCONTINUED] sertraline (ZOLOFT) 25 MG tablet Take 25 mg by mouth daily.  ? ?No facility-administered encounter medications on file as of 06/11/2021.  ? ? ?Allergies (verified) ?Latex  ? ?History: ?Past Medical History:  ?Diagnosis Date  ? Depression   ? Osteoporosis   ? Skin cancer   ? ?Past Surgical History:  ?Procedure Laterality Date  ? APPENDECTOMY    ? ORIF ELBOW FRACTURE Left 08/23/2020  ? Procedure: OPEN REDUCTION INTERNAL FIXATION (ORIF) LEFT ELBOW/OLECRANON FRACTURE;  Surgeon: XLeandrew Koyanagi MD;  [facility]: [facility]  Service: Orthopedics;  Laterality: Left;  ? ?Family History  ?Problem Relation Age of  Onset  ? Anemia Mother   ? Alcohol abuse Father   ? Alcohol abuse Sister   ? Colon cancer Neg Hx   ? Breast cancer Neg Hx   ? ?Social History  ? ?Socioeconomic History  ? Marital status: Married  ?  Spouse name: Franchot Mimes  ? Number of children: 2  ? Years of education: Not on file  ? Highest education level: Not on file  ?Occupational History  ? Not on file  ?Tobacco Use  ? Smoking status: Never  ? Smokeless tobacco: Never  ?Vaping Use  ? Vaping  Use: Never used  ?Substance and Sexual Activity  ? Alcohol use: Not Currently  ?  Comment: wine, 1 a day  ? Drug use: No  ? Sexual activity: Yes  ?Other Topics Concern  ? Not on file  ?Social History Narrative  ? Married 1977  ? Has chickens, goats, horses and 100 acres.    ? 2 daughters  ? 2 granddaughters, age 86 & 57 in 2023.  ? Cares for her elderly mother-age 60.  ? ?Social Determinants of Health  ? ?Financial Resource Strain: Low Risk   ? Difficulty of Paying Living Expenses: Not hard at all  ?Food Insecurity: No Food Insecurity  ? Worried About Charity fundraiser in the Last Year: Never true  ? Ran Out of Food in the Last Year: Never true  ?Transportation Needs: No Transportation Needs  ? Lack of Transportation (Medical): No  ? Lack of Transportation (Non-Medical): No  ?Physical Activity: Sufficiently Active  ? Days of Exercise per Week: 5 days  ? Minutes of Exercise per Session: 30 min  ?Stress: Stress Concern Present  ? Feeling of Stress : To some extent  ?Social Connections: Socially Integrated  ? Frequency of Communication with Friends and Family: More than three times a week  ? Frequency of Social Gatherings with Friends and Family: More than three times a week  ? Attends Religious Services: 1 to 4 times per year  ? Active Member of Clubs or Organizations: Yes  ? Attends Archivist Meetings: More than 4 times per year  ? Marital Status: Married  ? ? ?Tobacco Counseling ?Counseling given: Not Answered ? ? ?Clinical Intake: ? ?Pre-visit preparation completed: Yes ? ?Pain : No/denies pain ? ?  ? ?BMI - recorded: 21.4 ?Nutritional Risks: None ?Diabetes: No ? ?How often do you need to have someone help you when you read instructions, pamphlets, or other written materials from your doctor or pharmacy?: 1 - Never ? ?Diabetic?NO ? ?Interpreter Needed?: No ? ?Information entered by :: mj Eamon Tantillo, lpn ? ? ?Activities of Daily Living ? ?  06/11/2021  ? 10:32 AM 08/23/2020  ?  9:28 AM  ?In your present state  of health, do you have any difficulty performing the following activities:  ?Hearing? 1 0  ?Comment wears hearing aids   ?Vision? 0 0  ?Difficulty concentrating or making decisions? 0 0  ?Walking or climbing stairs? 0 0  ?Dressing or bathing? 0 1  ?Doing errands, shopping? 0   ?Preparing Food and eating ? N   ?Using the Toilet? N   ?In the past six months, have you accidently leaked urine? Y   ?Comment Stress incontinence.   ?Do you have problems with loss of bowel control? N   ?Managing your Medications? N   ?Managing your Finances? N   ?Housekeeping or managing your Housekeeping? N   ? ? ?Patient Care Team: ?Tonia Ghent,  MD as PCP - General (Family Medicine) ? ?Indicate any recent Medical Services you may have received from other than [facility] providers in the past year (date may be approximate). ? ?   ?Assessment:  ? This is a routine wellness examination for Joy Edwards. ? ?Hearing/Vision screen ?Hearing Screening - Comments:: Wears hearing aids. ?Vision Screening - Comments:: Glasses. Dr[facility] Glennon [facility] 2022. ? ?Dietary issues and exercise activities discussed: ?Current Exercise Habits: Home exercise routine, Type of exercise: walking, Time (Minutes): 30, Frequency (Times/Week): 5, Weekly Exercise (Minutes/Week): 150, Intensity: Mild, Exercise limited by: orthopedic condition(s) ? ? Goals Addressed   ? ?  ?  ?  ?  ?  ? This Visit's Progress  ?   I'd like to walk more when my leg is feeling better (pt-stated)   On track  ? ?  ? ?Depression Screen ? ?  06/11/2021  ? 10:29 AM 12/12/2020  ?  4:15 PM 06/21/2019  ? 10:46 AM  ?PHQ 2/9 Scores  ?PHQ - 2 Score 0 0 0  ?PHQ- 9 Score  1   ?  ?Fall Risk ? ?  06/11/2021  ? 10:31 AM 07/25/2020  ? 10:15 AM 07/24/2019  ?  8:19 AM 06/21/2019  ? 10:46 AM 11/14/2018  ?  9:18 AM  ?Fall Risk   ?Falls in the past year? 1 0 0 0 0  ?Number falls in past yr: 0 0     ?Injury with Fall? 0 0     ?Risk for fall due to : History of fall(s);Impaired balance/gait No Fall Risks      ?Follow up Falls prevention discussed Falls evaluation completed Falls evaluation completed Falls evaluation completed Falls evaluation completed  ? ? ?FALL RISK PREVENTION PERTAINING TO THE HOME: ? ?Any sta

## 2021-10-24 DIAGNOSIS — L258 Unspecified contact dermatitis due to other agents: Secondary | ICD-10-CM | POA: Diagnosis not present

## 2021-10-24 DIAGNOSIS — L821 Other seborrheic keratosis: Secondary | ICD-10-CM | POA: Diagnosis not present

## 2021-10-24 DIAGNOSIS — D225 Melanocytic nevi of trunk: Secondary | ICD-10-CM | POA: Diagnosis not present

## 2021-10-24 DIAGNOSIS — D2261 Melanocytic nevi of right upper limb, including shoulder: Secondary | ICD-10-CM | POA: Diagnosis not present

## 2021-10-24 DIAGNOSIS — L308 Other specified dermatitis: Secondary | ICD-10-CM | POA: Diagnosis not present

## 2021-10-24 DIAGNOSIS — D2271 Melanocytic nevi of right lower limb, including hip: Secondary | ICD-10-CM | POA: Diagnosis not present

## 2021-10-24 DIAGNOSIS — D2262 Melanocytic nevi of left upper limb, including shoulder: Secondary | ICD-10-CM | POA: Diagnosis not present

## 2021-10-24 DIAGNOSIS — L814 Other melanin hyperpigmentation: Secondary | ICD-10-CM | POA: Diagnosis not present

## 2021-10-24 DIAGNOSIS — L309 Dermatitis, unspecified: Secondary | ICD-10-CM | POA: Diagnosis not present

## 2021-10-24 DIAGNOSIS — D2272 Melanocytic nevi of left lower limb, including hip: Secondary | ICD-10-CM | POA: Diagnosis not present

## 2021-11-17 DIAGNOSIS — L239 Allergic contact dermatitis, unspecified cause: Secondary | ICD-10-CM | POA: Diagnosis not present

## 2021-11-19 DIAGNOSIS — L239 Allergic contact dermatitis, unspecified cause: Secondary | ICD-10-CM | POA: Diagnosis not present

## 2021-11-21 DIAGNOSIS — L239 Allergic contact dermatitis, unspecified cause: Secondary | ICD-10-CM | POA: Diagnosis not present

## 2021-12-29 ENCOUNTER — Telehealth: Payer: Self-pay | Admitting: Family Medicine

## 2021-12-29 NOTE — Telephone Encounter (Signed)
Called and spoke with patient about reactions she had to what was listed. I have updated patients chart.

## 2021-12-29 NOTE — Telephone Encounter (Signed)
Pt called wanting to add some allergies to her chart. Pt stated she was prescribed some eye drops from Dermatologist & pt believes she is having a allergic reaction. Pt states her allergies are: Bacitracin, bronopol, nickel sulphate. Call back # [phone]

## 2022-01-02 ENCOUNTER — Telehealth: Payer: PPO | Admitting: Emergency Medicine

## 2022-01-02 DIAGNOSIS — B9689 Other specified bacterial agents as the cause of diseases classified elsewhere: Secondary | ICD-10-CM

## 2022-01-02 DIAGNOSIS — J019 Acute sinusitis, unspecified: Secondary | ICD-10-CM | POA: Diagnosis not present

## 2022-01-02 MED ORDER — AMOXICILLIN-POT CLAVULANATE 875-125 MG PO TABS: 1.0000 | ORAL_TABLET | Freq: Two times a day (BID) | ORAL | 0 refills | Status: DC

## 2022-01-02 NOTE — Progress Notes (Signed)
Virtual Visit Consent   Joy Edwards, you are scheduled for a virtual visit with a [facility] provider today. Just as with appointments in the office, your consent must be obtained to participate. Your consent will be active for this visit and any virtual visit you may have with one of our providers in the next 365 days. If you have a MyChart account, a copy of this consent can be sent to you electronically.  As this is a virtual visit, video technology does not allow for your provider to perform a traditional examination. This may limit your provider's ability to fully assess your condition. If your provider identifies any concerns that need to be evaluated in person or the need to arrange testing (such as labs, EKG, etc.), we will make arrangements to do so. Although advances in technology are sophisticated, we cannot ensure that it will always work on either your end or our end. If the connection with a video visit is poor, the visit may have to be switched to a telephone visit. With either a video or telephone visit, we are not always able to ensure that we have a secure connection.  By engaging in this virtual visit, you consent to the provision of healthcare and authorize for your insurance to be billed (if applicable) for the services provided during this visit. Depending on your insurance coverage, you may receive a charge related to this service.  I need to obtain your verbal consent now. Are you willing to proceed with your visit today? Joy Edwards has provided verbal consent on 01/02/2022 for a virtual visit (video or telephone). Carvel Getting, NP  Date: 01/02/2022 11:10 AM  Virtual Visit via Video Note   I, Carvel Getting, connected with  Joy Edwards  ([id number], 12/11/1955) on 01/02/22 at 11:00 AM EST by a video-enabled telemedicine application and verified that I am speaking with the correct person using two identifiers.  Location: Patient: Virtual Visit Location  Patient: Home Provider: Virtual Visit Location Provider: Home Office   I discussed the limitations of evaluation and management by telemedicine and the availability of in person appointments. The patient expressed understanding and agreed to proceed.    History of Present Illness: Joy Edwards is a 66 y.o. who identifies as a female who was assigned female at birth, and is being seen today for sinus infection. Has been sick for a week with eye drainage, eye redness, nasal congestion, purulent nasal drainage (thick yellow, green, and sometimes bloody), productive cough with similar sputum is what is coming out of her nose.  She denies shortness of breath, wheezing.  She denies fever.  She does report low energy and chills.  She did test herself for COVID at home and it was negative.  She was given eyedrops several days ago by her optometrist that contained bacitracin for her pinkeye symptoms.  She had a severe allergic reaction to it and now bacitracin is listed in her allergies.  She was switched to tobramycin and feels her eye symptoms are much better.  She still has some eye drainage and her eyes are itchy.  Has had headaches and feels sinus pressure; thinks she has a sinus infection.  Has been using Mucinex at home but no other treatments for this illness.  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   History of nonmelanoma skin cancer 05/10/2017   Positional vertigo of left ear 04/20/2016   Vitamin D deficiency 03/29/2014   Routine general medical examination at a health care facility 06/30/2012   Insomnia secondary to depression with anxiety 06/30/2012   Post-menopausal osteoporosis     Allergies:   Allergies  Allergen Reactions   Bacitracin Other (See Comments)    Stinging sensation in and around eye   Bronopol Other (See Comments)    Known from allergy test done   Latex    Nickel Sulfate [Nickel] Other (See Comments)    Known from allergy test done   Medications:  Current Outpatient Medications:    amoxicillin-clavulanate (AUGMENTIN) 875-125 MG tablet, Take 1 tablet by mouth 2 (two) times daily., Disp: 14 tablet, Rfl: 0   alendronate (FOSAMAX) 70 MG tablet, Take 1 tablet (70 mg total) by mouth every 7 (seven) days. Take with a full glass of water on an empty stomach., Disp: 13 tablet, Rfl: 3   buPROPion (WELLBUTRIN XL) 300 MG 24 hr tablet, Take 300 mg by mouth daily., Disp: , Rfl:    Cholecalciferol (VITAMIN D3) 25 MCG (1000 UT) CAPS, Take 1 capsule (1,000 Units total) by mouth daily., Disp: , Rfl:    clobetasol ointment (TEMOVATE) 0.05 %, , Disp: , Rfl:    clotrimazole-betamethasone (LOTRISONE) cream, SMARTSIG:1 Topical Every Night, Disp: , Rfl:    sertraline (ZOLOFT) 50 MG tablet, Take 50 mg by mouth daily., Disp: , Rfl:    zinc sulfate 220 (50 Zn) MG capsule, Take 1 capsule (220 mg total) by mouth daily., Disp: 42 capsule, Rfl: 0  Observations/Objective: Patient is well-developed, well-nourished in no acute distress.  Resting comfortably  at home.  Head is normocephalic, atraumatic.  No labored breathing.  Speech is clear and coherent with logical content.  Patient is alert and oriented at baseline.    Assessment and Plan: 1. Acute bacterial sinusitis  Prescribed Augmentin.  Reviewed use of saline irrigation.  Patient to continue taking Mucinex.  Follow Up Instructions: I discussed the assessment and treatment plan with the patient. The patient was provided an opportunity to ask questions and all were answered. The patient agreed with the plan and demonstrated an understanding of the instructions.  A copy of instructions were sent to the patient via MyChart unless  otherwise noted below.   The patient was advised to call back or seek an in-person evaluation if the symptoms worsen or if the condition fails to improve as anticipated.  Time:  I spent 12 minutes with the patient via telehealth technology discussing the above problems/concerns.    Carvel Getting, NP

## 2022-01-02 NOTE — Patient Instructions (Signed)
Joy Edwards, thank you for joining Joy Getting, Joy Edwards for today's virtual visit.  While this provider is not your primary care provider (PCP), if your PCP is located in our provider database this encounter information will be shared with them immediately following your visit.   [facility] account gives you access to today's visit and all your visits, tests, and labs performed at [facility] " click here if you don't have a [facility] account or go to [email or web address]  Consent: (Patient) Joy Edwards provided verbal consent for this virtual visit at the beginning of the encounter.  Current Medications:  Current Outpatient Medications:    amoxicillin-clavulanate (AUGMENTIN) 875-125 MG tablet, Take 1 tablet by mouth 2 (two) times daily., Disp: 14 tablet, Rfl: 0   alendronate (FOSAMAX) 70 MG tablet, Take 1 tablet (70 mg total) by mouth every 7 (seven) days. Take with a full glass of water on an empty stomach., Disp: 13 tablet, Rfl: 3   buPROPion (WELLBUTRIN XL) 300 MG 24 hr tablet, Take 300 mg by mouth daily., Disp: , Rfl:    Cholecalciferol (VITAMIN D3) 25 MCG (1000 UT) CAPS, Take 1 capsule (1,000 Units total) by mouth daily., Disp: , Rfl:    clobetasol ointment (TEMOVATE) 0.05 %, , Disp: , Rfl:    clotrimazole-betamethasone (LOTRISONE) cream, SMARTSIG:1 Topical Every Night, Disp: , Rfl:    sertraline (ZOLOFT) 50 MG tablet, Take 50 mg by mouth daily., Disp: , Rfl:    zinc sulfate 220 (50 Zn) MG capsule, Take 1 capsule (220 mg total) by mouth daily., Disp: 42 capsule, Rfl: 0   Medications ordered in this encounter:  Meds ordered this encounter  Medications   amoxicillin-clavulanate (AUGMENTIN) 875-125 MG tablet    Sig: Take 1 tablet by mouth 2 (two) times daily.    Dispense:  14 tablet    Refill:  0     *If you need refills on other medications prior to your next appointment, please contact your pharmacy*  Follow-Up: Call back or seek  an in-person evaluation if the symptoms worsen or if the condition fails to improve as anticipated.  [place] [phone]  Other Instructions Continue taking Mucinex and drinking lots of liquids.  Continue using warm compresses to your eyes with a clean washcloth each time as needed.  Try using saline irrigation, such as with a neti pot, several times a day while you are sick. Many neti pots come with salt packets premeasured to use to make saline. If you use your own salt, make sure it is kosher salt or sea salt (don't use table salt as it has iodine in it and you don't need that in your nose). Use distilled water to make saline. If you mix your own saline using your own salt, the recipe is 1/4 teaspoon salt in 1 cup warm water. Using saline irrigation can help prevent and treat sinus infections.    If you have been instructed to have an in-person evaluation today at a local Urgent Care facility, please use the link below. It will take you to a list of all of our available [facility], including address, phone number and hours of operation. Please do not delay care.  [facility]  If you or a family member do not have a primary care provider, use the link below to schedule a visit and establish care. When you choose a [facility] primary care physician or advanced practice  provider, you gain a long-term partner in health. Find a Primary Care Provider  Learn more about [facility]'s in-office and virtual care options: [facility]

## 2022-05-21 ENCOUNTER — Telehealth: Payer: Self-pay | Admitting: Family Medicine

## 2022-05-21 NOTE — Telephone Encounter (Signed)
Contacted Joy Edwards to schedule their annual wellness visit. Appointment made for 06/02/2022.  [facility] Care Guide [facility] AWV TEAM Direct Dial: [phone]

## 2022-05-29 DIAGNOSIS — C4441 Basal cell carcinoma of skin of scalp and neck: Secondary | ICD-10-CM | POA: Diagnosis not present

## 2022-05-29 DIAGNOSIS — L814 Other melanin hyperpigmentation: Secondary | ICD-10-CM | POA: Diagnosis not present

## 2022-05-29 DIAGNOSIS — D485 Neoplasm of uncertain behavior of skin: Secondary | ICD-10-CM | POA: Diagnosis not present

## 2022-05-29 DIAGNOSIS — D2272 Melanocytic nevi of left lower limb, including hip: Secondary | ICD-10-CM | POA: Diagnosis not present

## 2022-05-29 DIAGNOSIS — D2271 Melanocytic nevi of right lower limb, including hip: Secondary | ICD-10-CM | POA: Diagnosis not present

## 2022-05-29 DIAGNOSIS — D2262 Melanocytic nevi of left upper limb, including shoulder: Secondary | ICD-10-CM | POA: Diagnosis not present

## 2022-05-29 DIAGNOSIS — L570 Actinic keratosis: Secondary | ICD-10-CM | POA: Diagnosis not present

## 2022-05-29 DIAGNOSIS — L820 Inflamed seborrheic keratosis: Secondary | ICD-10-CM | POA: Diagnosis not present

## 2022-05-29 DIAGNOSIS — D0461 Carcinoma in situ of skin of right upper limb, including shoulder: Secondary | ICD-10-CM | POA: Diagnosis not present

## 2022-05-29 DIAGNOSIS — L821 Other seborrheic keratosis: Secondary | ICD-10-CM | POA: Diagnosis not present

## 2022-05-29 DIAGNOSIS — D2261 Melanocytic nevi of right upper limb, including shoulder: Secondary | ICD-10-CM | POA: Diagnosis not present

## 2022-05-29 DIAGNOSIS — L538 Other specified erythematous conditions: Secondary | ICD-10-CM | POA: Diagnosis not present

## 2022-05-29 DIAGNOSIS — D225 Melanocytic nevi of trunk: Secondary | ICD-10-CM | POA: Diagnosis not present

## 2022-06-02 ENCOUNTER — Ambulatory Visit (INDEPENDENT_AMBULATORY_CARE_PROVIDER_SITE_OTHER): Payer: PPO

## 2022-06-02 VITALS — Ht 65.0 in | Wt 117.0 lb

## 2022-06-02 DIAGNOSIS — Z0000 Encounter for general adult medical examination without abnormal findings: Secondary | ICD-10-CM | POA: Diagnosis not present

## 2022-06-02 DIAGNOSIS — Z780 Asymptomatic menopausal state: Secondary | ICD-10-CM | POA: Diagnosis not present

## 2022-06-02 NOTE — Patient Instructions (Signed)
Joy Edwards , Thank you for taking time to come for your Medicare Wellness Visit. I appreciate your ongoing commitment to your health goals. Please review the following plan we discussed and let me know if I can assist you in the future.   These are the goals we discussed:  Goals       I'd like to walk more when my leg is feeling better (pt-stated)        This is a list of the screening recommended for you and due dates:  Health Maintenance  Topic Date Due   Pneumonia Vaccine (2 of 2 - PPSV23 or PCV20) 09/19/2020   COVID-19 Vaccine (4 - 2023-24 season) 10/03/2021   Flu Shot  09/03/2022   Mammogram  04/29/2023   Medicare Annual Wellness Visit  06/02/2023   Colon Cancer Screening  07/29/2027   DTaP/Tdap/Td vaccine (3 - Td or Tdap) 11/16/2029   DEXA scan (bone density measurement)  Completed   Hepatitis C Screening: USPSTF Recommendation to screen - Ages 56-79 yo.  Completed   Zoster (Shingles) Vaccine  Completed   HPV Vaccine  Aged Out    Advanced directives: yes  Conditions/risks identified: none  Next appointment: Follow up in one year for your annual wellness visit 06/03/23 @ 11:45   Preventive Care 65 Years and Older, Female Preventive care refers to lifestyle choices and visits with your health care provider that can promote health and wellness. What does preventive care include? A yearly physical exam. This is also called an annual well check. Dental exams once or twice a year. Routine eye exams. Ask your health care provider how often you should have your eyes checked. Personal lifestyle choices, including: Daily care of your teeth and gums. Regular physical activity. Eating a healthy diet. Avoiding tobacco and drug use. Limiting alcohol use. Practicing safe sex. Taking low-dose aspirin every day. Taking vitamin and mineral supplements as recommended by your health care provider. What happens during an annual well check? The services and screenings done by your  health care provider during your annual well check will depend on your age, overall health, lifestyle risk factors, and family history of disease. Counseling  Your health care provider may ask you questions about your: Alcohol use. Tobacco use. Drug use. Emotional well-being. Home and relationship well-being. Sexual activity. Eating habits. History of falls. Memory and ability to understand (cognition). Work and work Astronomer. Reproductive health. Screening  You may have the following tests or measurements: Height, weight, and BMI. Blood pressure. Lipid and cholesterol levels. These may be checked every 5 years, or more frequently if you are over 49 years old. Skin check. Lung cancer screening. You may have this screening every year starting at age 42 if you have a 30-pack-year history of smoking and currently smoke or have quit within the past 15 years. Fecal occult blood test (FOBT) of the stool. You may have this test every year starting at age 71. Flexible sigmoidoscopy or colonoscopy. You may have a sigmoidoscopy every 5 years or a colonoscopy every 10 years starting at age 82. Hepatitis C blood test. Hepatitis B blood test. Sexually transmitted disease (STD) testing. Diabetes screening. This is done by checking your blood sugar (glucose) after you have not eaten for a while (fasting). You may have this done every 1-3 years. Bone density scan. This is done to screen for osteoporosis. You may have this done starting at age 31. Mammogram. This may be done every 1-2 years. Talk to your health care  provider about how often you should have regular mammograms. Talk with your health care provider about your test results, treatment options, and if necessary, the need for more tests. Vaccines  Your health care provider may recommend certain vaccines, such as: Influenza vaccine. This is recommended every year. Tetanus, diphtheria, and acellular pertussis (Tdap, Td) vaccine. You may  need a Td booster every 10 years. Zoster vaccine. You may need this after age 40. Pneumococcal 13-valent conjugate (PCV13) vaccine. One dose is recommended after age 87. Pneumococcal polysaccharide (PPSV23) vaccine. One dose is recommended after age 13. Talk to your health care provider about which screenings and vaccines you need and how often you need them. This information is not intended to replace advice given to you by your health care provider. Make sure you discuss any questions you have with your health care provider. Document Released: 02/15/2015 Document Revised: 10/09/2015 Document Reviewed: 11/20/2014 Elsevier Interactive Patient Education  2017 [place] Prevention in the Home Falls can cause injuries. They can happen to people of all ages. There are many things you can do to make your home safe and to help prevent falls. What can I do on the outside of my home? Regularly fix the edges of walkways and driveways and fix any cracks. Remove anything that might make you trip as you walk through a door, such as a raised step or threshold. Trim any bushes or trees on the path to your home. Use bright outdoor lighting. Clear any walking paths of anything that might make someone trip, such as rocks or tools. Regularly check to see if handrails are loose or broken. Make sure that both sides of any steps have handrails. Any raised decks and porches should have guardrails on the edges. Have any leaves, snow, or ice cleared regularly. Use sand or salt on walking paths during winter. Clean up any spills in your garage right away. This includes oil or grease spills. What can I do in the bathroom? Use night lights. Install grab bars by the toilet and in the tub and shower. Do not use towel bars as grab bars. Use non-skid mats or decals in the tub or shower. If you need to sit down in the shower, use a plastic, non-slip stool. Keep the floor dry. Clean up any water that spills on  the floor as soon as it happens. Remove soap buildup in the tub or shower regularly. Attach bath mats securely with double-sided non-slip rug tape. Do not have throw rugs and other things on the floor that can make you trip. What can I do in the bedroom? Use night lights. Make sure that you have a light by your bed that is easy to reach. Do not use any sheets or blankets that are too big for your bed. They should not hang down onto the floor. Have a firm chair that has side arms. You can use this for support while you get dressed. Do not have throw rugs and other things on the floor that can make you trip. What can I do in the kitchen? Clean up any spills right away. Avoid walking on wet floors. Keep items that you use a lot in easy-to-reach places. If you need to reach something above you, use a strong step stool that has a grab bar. Keep electrical cords out of the way. Do not use floor polish or wax that makes floors slippery. If you must use wax, use non-skid floor wax. Do not have throw rugs  and other things on the floor that can make you trip. What can I do with my stairs? Do not leave any items on the stairs. Make sure that there are handrails on both sides of the stairs and use them. Fix handrails that are broken or loose. Make sure that handrails are as long as the stairways. Check any carpeting to make sure that it is firmly attached to the stairs. Fix any carpet that is loose or worn. Avoid having throw rugs at the top or bottom of the stairs. If you do have throw rugs, attach them to the floor with carpet tape. Make sure that you have a light switch at the top of the stairs and the bottom of the stairs. If you do not have them, ask someone to add them for you. What else can I do to help prevent falls? Wear shoes that: Do not have high heels. Have rubber bottoms. Are comfortable and fit you well. Are closed at the toe. Do not wear sandals. If you use a stepladder: Make sure  that it is fully opened. Do not climb a closed stepladder. Make sure that both sides of the stepladder are locked into place. Ask someone to hold it for you, if possible. Clearly mark and make sure that you can see: Any grab bars or handrails. First and last steps. Where the edge of each step is. Use tools that help you move around (mobility aids) if they are needed. These include: Canes. Walkers. Scooters. Crutches. Turn on the lights when you go into a dark area. Replace any light bulbs as soon as they burn out. Set up your furniture so you have a clear path. Avoid moving your furniture around. If any of your floors are uneven, fix them. If there are any pets around you, be aware of where they are. Review your medicines with your doctor. Some medicines can make you feel dizzy. This can increase your chance of falling. Ask your doctor what other things that you can do to help prevent falls. This information is not intended to replace advice given to you by your health care provider. Make sure you discuss any questions you have with your health care provider. Document Released: 11/15/2008 Document Revised: 06/27/2015 Document Reviewed: 02/23/2014 Elsevier Interactive Patient Education  2017 [employer].

## 2022-06-02 NOTE — Progress Notes (Signed)
I connected with  Lorette Ang on 06/02/22 by a video and audio enabled telemedicine application and verified that I am speaking with the correct person using two identifiers.  Patient Location: Home  Provider Location: Office/Clinic  I discussed the limitations of evaluation and management by telemedicine. The patient expressed understanding and agreed to proceed.  Subjective:   BRYLEA PITA is a 67 y.o. female who presents for Medicare Annual (Subsequent) preventive examination.  Review of Systems     Cardiac Risk Factors include: advanced age (>39men, >81 women);hypertension     Objective:    There were no vitals filed for this visit. There is no height or weight on file to calculate BMI.     06/02/2022    3:25 PM 06/11/2021   10:31 AM 10/29/2020    9:42 AM 08/19/2020    9:55 AM 09/28/2019    8:57 AM 06/21/2019   10:42 AM  Advanced Directives  Does Patient Have a Medical Advance Directive? Yes Yes Yes Yes Yes Yes  Type of Advance Directive Living will;Healthcare Power of [employer] Power of [place];Living will Healthcare Power of [place];Living will;Out of facility DNR (pink MOST or yellow form) Healthcare Power of [place];Living will Healthcare Power of [place];Living will Healthcare Power of [place];Living will  Does patient want to make changes to medical advance directive?   No - Patient declined No - Patient declined No - Patient declined No - Patient declined  Copy of Healthcare Power of Attorney in Chart? Yes - validated most recent copy scanned in chart (See row information) No - copy requested No - copy requested No - copy requested  No - copy requested    Current Medications (verified) Outpatient Encounter Medications as of 06/02/2022  Medication Sig   buPROPion (WELLBUTRIN XL) 300 MG 24 hr tablet Take 300 mg by mouth daily.   Cholecalciferol (VITAMIN D3) 25 MCG (1000 UT) CAPS Take 1 capsule (1,000 Units total) by mouth daily.   clobetasol  ointment (TEMOVATE) 0.05 %    sertraline (ZOLOFT) 50 MG tablet Take 50 mg by mouth daily.   zinc sulfate 220 (50 Zn) MG capsule Take 1 capsule (220 mg total) by mouth daily.   alendronate (FOSAMAX) 70 MG tablet Take 1 tablet (70 mg total) by mouth every 7 (seven) days. Take with a full glass of water on an empty stomach. (Patient not taking: Reported on 06/02/2022)   amoxicillin-clavulanate (AUGMENTIN) 875-125 MG tablet Take 1 tablet by mouth 2 (two) times daily. (Patient not taking: Reported on 06/02/2022)   clotrimazole-betamethasone (LOTRISONE) cream SMARTSIG:1 Topical Every Night (Patient not taking: Reported on 06/02/2022)   No facility-administered encounter medications on file as of 06/02/2022.    Allergies (verified) Bacitracin, Bronopol, Latex, and Nickel sulfate [nickel]   History: Past Medical History:  Diagnosis Date   Depression    Osteoporosis    Skin cancer    Past Surgical History:  Procedure Laterality Date   APPENDECTOMY     ORIF ELBOW FRACTURE Left 08/23/2020   Procedure: OPEN REDUCTION INTERNAL FIXATION (ORIF) LEFT ELBOW/OLECRANON FRACTURE;  Surgeon: Tarry Kos, MD;  Location: [facility];  Service: Orthopedics;  Laterality: Left;   Family History  Problem Relation Age of Onset   Anemia Mother    Alcohol abuse Father    Alcohol abuse Sister    Colon cancer Neg Hx    Breast cancer Neg Hx    Social History   Socioeconomic History   Marital status: Married  Spouse name: Lorelle Gibbs   Number of children: 2   Years of education: Not on file   Highest education level: Not on file  Occupational History   Not on file  Tobacco Use   Smoking status: Never   Smokeless tobacco: Never  Vaping Use   Vaping Use: Never used  Substance and Sexual Activity   Alcohol use: Not Currently    Comment: wine, 1 a day   Drug use: No   Sexual activity: Yes  Other Topics Concern   Not on file  Social History Narrative   Married 1977   Has chickens, goats,  horses and 100 acres.     2 daughters   2 granddaughters, age 74 & 7 in 2023.   Cares for her elderly mother-age 7.   Social Determinants of Health   Financial Resource Strain: Low Risk  (06/11/2021)   Overall Financial Resource Strain (CARDIA)    Difficulty of Paying Living Expenses: Not hard at all  Food Insecurity: No Food Insecurity (06/02/2022)   Hunger Vital Sign    Worried About Running Out of Food in the Last Year: Never true    Ran Out of Food in the Last Year: Never true  Transportation Needs: No Transportation Needs (06/02/2022)   PRAPARE - Administrator, Civil Service (Medical): No    Lack of Transportation (Non-Medical): No  Physical Activity: Sufficiently Active (06/11/2021)   Exercise Vital Sign    Days of Exercise per Week: 5 days    Minutes of Exercise per Session: 30 min  Stress: No Stress Concern Present (06/02/2022)   [employer] of Occupational Health - Occupational Stress Questionnaire    Feeling of Stress : Not at all  Social Connections: Moderately Isolated (06/02/2022)   Social Connection and Isolation Panel [NHANES]    Frequency of Communication with Friends and Family: More than three times a week    Frequency of Social Gatherings with Friends and Family: More than three times a week    Attends Religious Services: Never    Database administrator or Organizations: No    Attends Engineer, structural: Never    Marital Status: Married    Tobacco Counseling Counseling given: Not Answered   Clinical Intake:     Pain : No/denies pain     Nutritional Risks: None Diabetes: No  How often do you need to have someone help you when you read instructions, pamphlets, or other written materials from your doctor or pharmacy?: 1 - Never  Diabetic?no  Interpreter Needed?: No  Comments: married Information entered by :: P.Foy CMA   Activities of Daily Living    06/02/2022    3:27 PM 06/11/2021   10:32 AM  In your present state  of health, do you have any difficulty performing the following activities:  Hearing? 0 1  Comment  wears hearing aids  Vision? 0 0  Difficulty concentrating or making decisions? 0 0  Walking or climbing stairs? 0 0  Dressing or bathing? 0 0  Doing errands, shopping? 0 0  Preparing Food and eating ? N N  Using the Toilet? N N  In the past six months, have you accidently leaked urine? Y Y  Comment  Stress incontinence.  Do you have problems with loss of bowel control? N N  Managing your Medications? N N  Managing your Finances? N N  Housekeeping or managing your Housekeeping? N N    Patient Care Team: Joaquim Nam, MD  as PCP - General (Family Medicine)  Indicate any recent Medical Services you may have received from other than [facility] providers in the past year (date may be approximate).     Assessment:   This is a routine wellness examination for [place].  Hearing/Vision screen Hearing Screening - Comments:: Has hearing aids Vision Screening - Comments:: Wears glasses- [facility]  Dietary issues and exercise activities discussed: Current Exercise Habits: Structured exercise class, Type of exercise: walking, Time (Minutes): 40, Frequency (Times/Week): 7, Weekly Exercise (Minutes/Week): 280, Intensity: Mild, Exercise limited by: None identified   Goals Addressed   None    Depression Screen    06/02/2022    3:27 PM 06/02/2022    3:24 PM 06/11/2021   10:29 AM 12/12/2020    4:15 PM 06/21/2019   10:46 AM  PHQ 2/9 Scores  PHQ - 2 Score 0 0 0 0 0  PHQ- 9 Score    1     Fall Risk    06/02/2022    3:27 PM 06/11/2021   10:31 AM 07/25/2020   10:15 AM 07/24/2019    8:19 AM 06/21/2019   10:46 AM  Fall Risk   Falls in the past year? 0 1 0 0 0  Number falls in past yr: 0 0 0    Injury with Fall? 0 0 0    Risk for fall due to : No Fall Risks History of fall(s);Impaired balance/gait No Fall Risks    Follow up Falls prevention discussed;Falls evaluation  completed;Education provided Falls prevention discussed Falls evaluation completed Falls evaluation completed Falls evaluation completed    FALL RISK PREVENTION PERTAINING TO THE HOME:  Any stairs in or around the home? Yes  If so, are there any without handrails? Yes  Home free of loose throw rugs in walkways, pet beds, electrical cords, etc? Yes  Adequate lighting in your home to reduce risk of falls? Yes   ASSISTIVE DEVICES UTILIZED TO PREVENT FALLS:  Life alert? No  Use of a cane, walker or w/c? No  Grab bars in the bathroom? No  Shower chair or bench in shower? Yes  Elevated toilet seat or a handicapped toilet? Yes    Cognitive Function:    06/21/2019   10:54 AM  MMSE - Mini Mental State Exam  Not completed: Unable to complete        06/02/2022    3:36 PM 06/11/2021   10:34 AM  6CIT Screen  What Year? 0 points 0 points  What month? 0 points 0 points  What time? 0 points 0 points  Count back from 20 0 points 0 points  Months in reverse 0 points 0 points  Repeat phrase 0 points 0 points  Total Score 0 points 0 points    Immunizations Immunization History  Administered Date(s) Administered   Fluad Quad(high Dose 65+) 12/06/2018   Influenza Split 10/27/2010   Influenza,inj,Quad PF,6+ Mos 03/27/2014   Influenza-Unspecified 12/09/2012, 09/07/2019, 10/30/2020   PFIZER(Purple Top)SARS-COV-2 Vaccination 02/21/2019, 03/11/2019, 10/30/2020   Pneumococcal Conjugate-13 07/25/2020   Tdap 03/27/2014, 11/17/2019   Zoster Recombinat (Shingrix) 04/19/2017, 07/14/2017    TDAP status: Up to date  Flu Vaccine status: Due, Education has been provided regarding the importance of this vaccine. Advised may receive this vaccine at local pharmacy or Health Dept. Aware to provide a copy of the vaccination record if obtained from local pharmacy or Health Dept. Verbalized acceptance and understanding.  Pneumococcal vaccine status: Due, Education has been provided regarding the  importance  of this vaccine. Advised may receive this vaccine at local pharmacy or Health Dept. Aware to provide a copy of the vaccination record if obtained from local pharmacy or Health Dept. Verbalized acceptance and understanding.  Covid-19 vaccine status: Information provided on how to obtain vaccines.   Qualifies for Shingles Vaccine? Yes   Zostavax completed No   Shingrix Completed?: No.    Education has been provided regarding the importance of this vaccine. Patient has been advised to call insurance company to determine out of pocket expense if they have not yet received this vaccine. Advised may also receive vaccine at local pharmacy or Health Dept. Verbalized acceptance and understanding.  Screening Tests Health Maintenance  Topic Date Due   Pneumonia Vaccine 58+ Years old (2 of 2 - PPSV23 or PCV20) 09/19/2020   COVID-19 Vaccine (4 - 2023-24 season) 10/03/2021   INFLUENZA VACCINE  09/03/2022   MAMMOGRAM  04/29/2023   Medicare Annual Wellness (AWV)  06/02/2023   COLONOSCOPY (Pts 45-73yrs Insurance coverage will need to be confirmed)  07/29/2027   DTaP/Tdap/Td (3 - Td or Tdap) 11/16/2029   DEXA SCAN  Completed   Hepatitis C Screening  Completed   Zoster Vaccines- Shingrix  Completed   HPV VACCINES  Aged Out    Health Maintenance  Health Maintenance Due  Topic Date Due   Pneumonia Vaccine 65+ Years old (2 of 2 - PPSV23 or PCV20) 09/19/2020   COVID-19 Vaccine (4 - 2023-24 season) 10/03/2021    Colorectal cancer screening: Type of screening: Colonoscopy. Completed 07/28/2017. Repeat every 10 years  Mammogram status: Completed 04/28/21. Repeat every year patient has appt scheduled for next week.  Bone Density status: Ordered 06/02/22. Pt provided with contact info and advised to call to schedule appt.  Lung Cancer Screening: (Low Dose CT Chest recommended if Age 74-80 years, 30 pack-year currently smoking OR have quit w/in 15years.) does not qualify.   Lung Cancer  Screening Referral: no  Additional Screening:  Hepatitis C Screening: does qualify;due at next visit  Vision Screening: Recommended annual ophthalmology exams for early detection of glaucoma and other disorders of the eye. Is the patient up to date with their annual eye exam?  Yes  Who is the provider or what is the name of the office in which the patient attends annual eye exams? [name] If pt is not established with a provider, would they like to be referred to a provider to establish care? No .   Dental Screening: Recommended annual dental exams for proper oral hygiene  Community Resource Referral / Chronic Care Management: CRR required this visit?  No   CCM required this visit?  No      Plan:     I have personally reviewed and noted the following in the patient's chart:   Medical and social history Use of alcohol, tobacco or illicit drugs  Current medications and supplements including opioid prescriptions. Patient is not currently taking opioid prescriptions. Functional ability and status Nutritional status Physical activity Advanced directives List of other physicians Hospitalizations, surgeries, and ER visits in previous 12 months Vitals Screenings to include cognitive, depression, and falls Referrals and appointments  In addition, I have reviewed and discussed with patient certain preventive protocols, quality metrics, and best practice recommendations. A written personalized care plan for preventive services as well as general preventive health recommendations were provided to patient.     Maryan Puls, LPN   1/61/0960   Nurse Notes: no concerns

## 2022-06-10 DIAGNOSIS — Z1231 Encounter for screening mammogram for malignant neoplasm of breast: Secondary | ICD-10-CM | POA: Diagnosis not present

## 2022-06-10 LAB — HM MAMMOGRAPHY

## 2022-06-11 ENCOUNTER — Encounter: Payer: Self-pay | Admitting: Family Medicine

## 2022-06-22 ENCOUNTER — Encounter: Payer: Self-pay | Admitting: Family Medicine

## 2022-06-22 ENCOUNTER — Encounter: Payer: Self-pay | Admitting: *Deleted

## 2022-06-22 ENCOUNTER — Ambulatory Visit (INDEPENDENT_AMBULATORY_CARE_PROVIDER_SITE_OTHER): Payer: PPO | Admitting: Family Medicine

## 2022-06-22 VITALS — BP 102/56 | HR 74 | Temp 98.3°F | Ht 65.0 in | Wt 118.0 lb

## 2022-06-22 DIAGNOSIS — R6889 Other general symptoms and signs: Secondary | ICD-10-CM

## 2022-06-22 NOTE — Patient Instructions (Signed)
Let me know if you don't get call about the follow up CT.  Take care.  Glad to see you.

## 2022-06-22 NOTE — Progress Notes (Unsigned)
She had breast artery calcifications.  No h/o CAD.  No CP SOB BLE edema.    Her mother is in [place] and she is requiring more care.  D/w pt.    D/w pt about getting PNA 23 vaccine at pharmacy.    Meds, vitals, and allergies reviewed.   ROS: Per HPI unless specifically indicated in ROS section

## 2022-06-24 DIAGNOSIS — R6889 Other general symptoms and signs: Secondary | ICD-10-CM | POA: Insufficient documentation

## 2022-06-24 NOTE — Assessment & Plan Note (Signed)
Discussed rationale for cardiac CT and management of results (i.e. no calcification, mild calcification, severe calcification).  She will let me know if she has trouble getting scheduled.  Ordered.

## 2022-07-06 ENCOUNTER — Ambulatory Visit
Admission: RE | Admit: 2022-07-06 | Discharge: 2022-07-06 | Disposition: A | Discharge status: Home / Self Care | Payer: PPO | Source: Ambulatory Visit | Attending: Family Medicine | Admitting: Family Medicine

## 2022-07-06 DIAGNOSIS — R6889 Other general symptoms and signs: Secondary | ICD-10-CM

## 2022-07-09 DIAGNOSIS — D0461 Carcinoma in situ of skin of right upper limb, including shoulder: Secondary | ICD-10-CM | POA: Diagnosis not present

## 2022-07-23 DIAGNOSIS — C44519 Basal cell carcinoma of skin of other part of trunk: Secondary | ICD-10-CM | POA: Diagnosis not present

## 2022-12-11 DIAGNOSIS — M545 Low back pain, unspecified: Secondary | ICD-10-CM | POA: Diagnosis not present

## 2022-12-11 DIAGNOSIS — M9904 Segmental and somatic dysfunction of sacral region: Secondary | ICD-10-CM | POA: Diagnosis not present

## 2022-12-11 DIAGNOSIS — M9903 Segmental and somatic dysfunction of lumbar region: Secondary | ICD-10-CM | POA: Diagnosis not present

## 2022-12-11 DIAGNOSIS — M9901 Segmental and somatic dysfunction of cervical region: Secondary | ICD-10-CM | POA: Diagnosis not present

## 2022-12-14 DIAGNOSIS — M9903 Segmental and somatic dysfunction of lumbar region: Secondary | ICD-10-CM | POA: Diagnosis not present

## 2022-12-14 DIAGNOSIS — M545 Low back pain, unspecified: Secondary | ICD-10-CM | POA: Diagnosis not present

## 2022-12-14 DIAGNOSIS — M9901 Segmental and somatic dysfunction of cervical region: Secondary | ICD-10-CM | POA: Diagnosis not present

## 2022-12-14 DIAGNOSIS — M9904 Segmental and somatic dysfunction of sacral region: Secondary | ICD-10-CM | POA: Diagnosis not present

## 2022-12-16 DIAGNOSIS — M9903 Segmental and somatic dysfunction of lumbar region: Secondary | ICD-10-CM | POA: Diagnosis not present

## 2022-12-16 DIAGNOSIS — M9901 Segmental and somatic dysfunction of cervical region: Secondary | ICD-10-CM | POA: Diagnosis not present

## 2022-12-16 DIAGNOSIS — M545 Low back pain, unspecified: Secondary | ICD-10-CM | POA: Diagnosis not present

## 2022-12-16 DIAGNOSIS — M9904 Segmental and somatic dysfunction of sacral region: Secondary | ICD-10-CM | POA: Diagnosis not present

## 2022-12-18 DIAGNOSIS — M545 Low back pain, unspecified: Secondary | ICD-10-CM | POA: Diagnosis not present

## 2022-12-18 DIAGNOSIS — M9903 Segmental and somatic dysfunction of lumbar region: Secondary | ICD-10-CM | POA: Diagnosis not present

## 2022-12-18 DIAGNOSIS — M9901 Segmental and somatic dysfunction of cervical region: Secondary | ICD-10-CM | POA: Diagnosis not present

## 2022-12-18 DIAGNOSIS — M9904 Segmental and somatic dysfunction of sacral region: Secondary | ICD-10-CM | POA: Diagnosis not present

## 2022-12-21 DIAGNOSIS — M545 Low back pain, unspecified: Secondary | ICD-10-CM | POA: Diagnosis not present

## 2022-12-21 DIAGNOSIS — M9903 Segmental and somatic dysfunction of lumbar region: Secondary | ICD-10-CM | POA: Diagnosis not present

## 2022-12-21 DIAGNOSIS — M9901 Segmental and somatic dysfunction of cervical region: Secondary | ICD-10-CM | POA: Diagnosis not present

## 2022-12-21 DIAGNOSIS — M9904 Segmental and somatic dysfunction of sacral region: Secondary | ICD-10-CM | POA: Diagnosis not present

## 2022-12-23 DIAGNOSIS — M9903 Segmental and somatic dysfunction of lumbar region: Secondary | ICD-10-CM | POA: Diagnosis not present

## 2022-12-23 DIAGNOSIS — M545 Low back pain, unspecified: Secondary | ICD-10-CM | POA: Diagnosis not present

## 2022-12-23 DIAGNOSIS — M9904 Segmental and somatic dysfunction of sacral region: Secondary | ICD-10-CM | POA: Diagnosis not present

## 2022-12-23 DIAGNOSIS — M9901 Segmental and somatic dysfunction of cervical region: Secondary | ICD-10-CM | POA: Diagnosis not present

## 2022-12-28 DIAGNOSIS — M9903 Segmental and somatic dysfunction of lumbar region: Secondary | ICD-10-CM | POA: Diagnosis not present

## 2022-12-28 DIAGNOSIS — M9901 Segmental and somatic dysfunction of cervical region: Secondary | ICD-10-CM | POA: Diagnosis not present

## 2022-12-28 DIAGNOSIS — M545 Low back pain, unspecified: Secondary | ICD-10-CM | POA: Diagnosis not present

## 2022-12-28 DIAGNOSIS — M9904 Segmental and somatic dysfunction of sacral region: Secondary | ICD-10-CM | POA: Diagnosis not present

## 2023-01-04 DIAGNOSIS — M9904 Segmental and somatic dysfunction of sacral region: Secondary | ICD-10-CM | POA: Diagnosis not present

## 2023-01-04 DIAGNOSIS — M9901 Segmental and somatic dysfunction of cervical region: Secondary | ICD-10-CM | POA: Diagnosis not present

## 2023-01-04 DIAGNOSIS — M545 Low back pain, unspecified: Secondary | ICD-10-CM | POA: Diagnosis not present

## 2023-01-04 DIAGNOSIS — M9903 Segmental and somatic dysfunction of lumbar region: Secondary | ICD-10-CM | POA: Diagnosis not present

## 2023-01-06 DIAGNOSIS — Z85828 Personal history of other malignant neoplasm of skin: Secondary | ICD-10-CM | POA: Diagnosis not present

## 2023-01-06 DIAGNOSIS — D225 Melanocytic nevi of trunk: Secondary | ICD-10-CM | POA: Diagnosis not present

## 2023-01-06 DIAGNOSIS — L821 Other seborrheic keratosis: Secondary | ICD-10-CM | POA: Diagnosis not present

## 2023-01-06 DIAGNOSIS — M9901 Segmental and somatic dysfunction of cervical region: Secondary | ICD-10-CM | POA: Diagnosis not present

## 2023-01-06 DIAGNOSIS — M9904 Segmental and somatic dysfunction of sacral region: Secondary | ICD-10-CM | POA: Diagnosis not present

## 2023-01-06 DIAGNOSIS — M545 Low back pain, unspecified: Secondary | ICD-10-CM | POA: Diagnosis not present

## 2023-01-06 DIAGNOSIS — D2272 Melanocytic nevi of left lower limb, including hip: Secondary | ICD-10-CM | POA: Diagnosis not present

## 2023-01-06 DIAGNOSIS — D2261 Melanocytic nevi of right upper limb, including shoulder: Secondary | ICD-10-CM | POA: Diagnosis not present

## 2023-01-06 DIAGNOSIS — L308 Other specified dermatitis: Secondary | ICD-10-CM | POA: Diagnosis not present

## 2023-01-06 DIAGNOSIS — L578 Other skin changes due to chronic exposure to nonionizing radiation: Secondary | ICD-10-CM | POA: Diagnosis not present

## 2023-01-06 DIAGNOSIS — M9903 Segmental and somatic dysfunction of lumbar region: Secondary | ICD-10-CM | POA: Diagnosis not present

## 2023-01-06 DIAGNOSIS — D2262 Melanocytic nevi of left upper limb, including shoulder: Secondary | ICD-10-CM | POA: Diagnosis not present

## 2023-01-08 DIAGNOSIS — M9904 Segmental and somatic dysfunction of sacral region: Secondary | ICD-10-CM | POA: Diagnosis not present

## 2023-01-08 DIAGNOSIS — M9903 Segmental and somatic dysfunction of lumbar region: Secondary | ICD-10-CM | POA: Diagnosis not present

## 2023-01-08 DIAGNOSIS — M545 Low back pain, unspecified: Secondary | ICD-10-CM | POA: Diagnosis not present

## 2023-01-08 DIAGNOSIS — M9901 Segmental and somatic dysfunction of cervical region: Secondary | ICD-10-CM | POA: Diagnosis not present

## 2023-01-11 DIAGNOSIS — M9901 Segmental and somatic dysfunction of cervical region: Secondary | ICD-10-CM | POA: Diagnosis not present

## 2023-01-11 DIAGNOSIS — M9904 Segmental and somatic dysfunction of sacral region: Secondary | ICD-10-CM | POA: Diagnosis not present

## 2023-01-11 DIAGNOSIS — M9903 Segmental and somatic dysfunction of lumbar region: Secondary | ICD-10-CM | POA: Diagnosis not present

## 2023-01-11 DIAGNOSIS — M545 Low back pain, unspecified: Secondary | ICD-10-CM | POA: Diagnosis not present

## 2023-01-13 DIAGNOSIS — M9903 Segmental and somatic dysfunction of lumbar region: Secondary | ICD-10-CM | POA: Diagnosis not present

## 2023-01-13 DIAGNOSIS — M9904 Segmental and somatic dysfunction of sacral region: Secondary | ICD-10-CM | POA: Diagnosis not present

## 2023-01-13 DIAGNOSIS — M9901 Segmental and somatic dysfunction of cervical region: Secondary | ICD-10-CM | POA: Diagnosis not present

## 2023-01-13 DIAGNOSIS — M545 Low back pain, unspecified: Secondary | ICD-10-CM | POA: Diagnosis not present

## 2023-01-15 DIAGNOSIS — M8588 Other specified disorders of bone density and structure, other site: Secondary | ICD-10-CM | POA: Diagnosis not present

## 2023-01-15 LAB — HM DEXA SCAN

## 2023-01-18 ENCOUNTER — Encounter: Payer: Self-pay | Admitting: Family Medicine

## 2023-01-19 DIAGNOSIS — M9903 Segmental and somatic dysfunction of lumbar region: Secondary | ICD-10-CM | POA: Diagnosis not present

## 2023-01-19 DIAGNOSIS — M545 Low back pain, unspecified: Secondary | ICD-10-CM | POA: Diagnosis not present

## 2023-01-19 DIAGNOSIS — M9901 Segmental and somatic dysfunction of cervical region: Secondary | ICD-10-CM | POA: Diagnosis not present

## 2023-01-19 DIAGNOSIS — M9904 Segmental and somatic dysfunction of sacral region: Secondary | ICD-10-CM | POA: Diagnosis not present

## 2023-01-24 ENCOUNTER — Encounter: Payer: Self-pay | Admitting: Family Medicine

## 2023-01-29 ENCOUNTER — Ambulatory Visit: Payer: PPO | Admitting: Physician Assistant

## 2023-01-29 ENCOUNTER — Other Ambulatory Visit (INDEPENDENT_AMBULATORY_CARE_PROVIDER_SITE_OTHER): Payer: Self-pay

## 2023-01-29 ENCOUNTER — Encounter: Payer: Self-pay | Admitting: Physician Assistant

## 2023-01-29 DIAGNOSIS — M810 Age-related osteoporosis without current pathological fracture: Secondary | ICD-10-CM | POA: Diagnosis not present

## 2023-01-29 DIAGNOSIS — M25561 Pain in right knee: Secondary | ICD-10-CM | POA: Diagnosis not present

## 2023-01-29 NOTE — Addendum Note (Signed)
Addended by: Javier Glazier on: 01/29/2023 12:56 PM   Modules accepted: Orders
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
## 2023-02-02 DIAGNOSIS — M9901 Segmental and somatic dysfunction of cervical region: Secondary | ICD-10-CM | POA: Diagnosis not present

## 2023-02-02 DIAGNOSIS — M9903 Segmental and somatic dysfunction of lumbar region: Secondary | ICD-10-CM | POA: Diagnosis not present

## 2023-02-02 DIAGNOSIS — M545 Low back pain, unspecified: Secondary | ICD-10-CM | POA: Diagnosis not present

## 2023-02-02 DIAGNOSIS — M9904 Segmental and somatic dysfunction of sacral region: Secondary | ICD-10-CM | POA: Diagnosis not present

## 2023-02-05 DIAGNOSIS — M9901 Segmental and somatic dysfunction of cervical region: Secondary | ICD-10-CM | POA: Diagnosis not present

## 2023-02-05 DIAGNOSIS — M9904 Segmental and somatic dysfunction of sacral region: Secondary | ICD-10-CM | POA: Diagnosis not present

## 2023-02-05 DIAGNOSIS — M545 Low back pain, unspecified: Secondary | ICD-10-CM | POA: Diagnosis not present

## 2023-02-05 DIAGNOSIS — M9903 Segmental and somatic dysfunction of lumbar region: Secondary | ICD-10-CM | POA: Diagnosis not present

## 2023-02-11 DIAGNOSIS — M9901 Segmental and somatic dysfunction of cervical region: Secondary | ICD-10-CM | POA: Diagnosis not present

## 2023-02-11 DIAGNOSIS — M9903 Segmental and somatic dysfunction of lumbar region: Secondary | ICD-10-CM | POA: Diagnosis not present

## 2023-02-11 DIAGNOSIS — M9904 Segmental and somatic dysfunction of sacral region: Secondary | ICD-10-CM | POA: Diagnosis not present

## 2023-02-11 DIAGNOSIS — M545 Low back pain, unspecified: Secondary | ICD-10-CM | POA: Diagnosis not present

## 2023-02-15 DIAGNOSIS — M9901 Segmental and somatic dysfunction of cervical region: Secondary | ICD-10-CM | POA: Diagnosis not present

## 2023-02-15 DIAGNOSIS — M545 Low back pain, unspecified: Secondary | ICD-10-CM | POA: Diagnosis not present

## 2023-02-15 DIAGNOSIS — M9903 Segmental and somatic dysfunction of lumbar region: Secondary | ICD-10-CM | POA: Diagnosis not present

## 2023-02-15 DIAGNOSIS — M9904 Segmental and somatic dysfunction of sacral region: Secondary | ICD-10-CM | POA: Diagnosis not present

## 2023-02-18 DIAGNOSIS — M545 Low back pain, unspecified: Secondary | ICD-10-CM | POA: Diagnosis not present

## 2023-02-18 DIAGNOSIS — M9903 Segmental and somatic dysfunction of lumbar region: Secondary | ICD-10-CM | POA: Diagnosis not present

## 2023-02-18 DIAGNOSIS — M9904 Segmental and somatic dysfunction of sacral region: Secondary | ICD-10-CM | POA: Diagnosis not present

## 2023-02-18 DIAGNOSIS — M9901 Segmental and somatic dysfunction of cervical region: Secondary | ICD-10-CM | POA: Diagnosis not present

## 2023-03-03 DIAGNOSIS — M9903 Segmental and somatic dysfunction of lumbar region: Secondary | ICD-10-CM | POA: Diagnosis not present

## 2023-03-03 DIAGNOSIS — M9901 Segmental and somatic dysfunction of cervical region: Secondary | ICD-10-CM | POA: Diagnosis not present

## 2023-03-03 DIAGNOSIS — M9904 Segmental and somatic dysfunction of sacral region: Secondary | ICD-10-CM | POA: Diagnosis not present

## 2023-03-03 DIAGNOSIS — M545 Low back pain, unspecified: Secondary | ICD-10-CM | POA: Diagnosis not present

## 2023-03-08 DIAGNOSIS — M9903 Segmental and somatic dysfunction of lumbar region: Secondary | ICD-10-CM | POA: Diagnosis not present

## 2023-03-08 DIAGNOSIS — M9904 Segmental and somatic dysfunction of sacral region: Secondary | ICD-10-CM | POA: Diagnosis not present

## 2023-03-08 DIAGNOSIS — M9901 Segmental and somatic dysfunction of cervical region: Secondary | ICD-10-CM | POA: Diagnosis not present

## 2023-03-08 DIAGNOSIS — M545 Low back pain, unspecified: Secondary | ICD-10-CM | POA: Diagnosis not present

## 2023-03-16 DIAGNOSIS — M545 Low back pain, unspecified: Secondary | ICD-10-CM | POA: Diagnosis not present

## 2023-03-16 DIAGNOSIS — M9904 Segmental and somatic dysfunction of sacral region: Secondary | ICD-10-CM | POA: Diagnosis not present

## 2023-03-16 DIAGNOSIS — M9903 Segmental and somatic dysfunction of lumbar region: Secondary | ICD-10-CM | POA: Diagnosis not present

## 2023-03-16 DIAGNOSIS — M9901 Segmental and somatic dysfunction of cervical region: Secondary | ICD-10-CM | POA: Diagnosis not present

## 2023-03-21 ENCOUNTER — Telehealth: Payer: PPO | Admitting: Family Medicine

## 2023-03-21 DIAGNOSIS — J111 Influenza due to unidentified influenza virus with other respiratory manifestations: Secondary | ICD-10-CM

## 2023-03-21 MED ORDER — OSELTAMIVIR PHOSPHATE 75 MG PO CAPS: 75.0000 mg | ORAL_CAPSULE | Freq: Two times a day (BID) | ORAL | 0 refills | Status: AC

## 2023-03-21 MED ORDER — PROMETHAZINE-DM 6.25-15 MG/5ML PO SYRP: 5.0000 mL | ORAL_SOLUTION | Freq: Four times a day (QID) | ORAL | 0 refills | Status: AC | PRN

## 2023-03-21 NOTE — Progress Notes (Signed)
Virtual Visit Consent   Joy Edwards, you are scheduled for a virtual visit with a [facility] provider today. Just as with appointments in the office, your consent must be obtained to participate. Your consent will be active for this visit and any virtual visit you may have with one of our providers in the next 365 days. If you have a MyChart account, a copy of this consent can be sent to you electronically.  As this is a virtual visit, video technology does not allow for your provider to perform a traditional examination. This may limit your provider's ability to fully assess your condition. If your provider identifies any concerns that need to be evaluated in person or the need to arrange testing (such as labs, EKG, etc.), we will make arrangements to do so. Although advances in technology are sophisticated, we cannot ensure that it will always work on either your end or our end. If the connection with a video visit is poor, the visit may have to be switched to a telephone visit. With either a video or telephone visit, we are not always able to ensure that we have a secure connection.  By engaging in this virtual visit, you consent to the provision of healthcare and authorize for your insurance to be billed (if applicable) for the services provided during this visit. Depending on your insurance coverage, you may receive a charge related to this service.  I need to obtain your verbal consent now. Are you willing to proceed with your visit today? Joy Edwards has provided verbal consent on 03/21/2023 for a virtual visit (video or telephone). Georgana Curio, FNP  Date: 03/21/2023 11:52 AM   Virtual Visit via Video Note   I, Georgana Curio, connected with  Joy Edwards  ([id number], 09/03/1955) on 03/21/23 at 11:45 AM EST by a video-enabled telemedicine application and verified that I am speaking with the correct person using two identifiers.  Location: Patient: Virtual Visit Location  Patient: Home Provider: Virtual Visit Location Provider: Home Office   I discussed the limitations of evaluation and management by telemedicine and the availability of in person appointments. The patient expressed understanding and agreed to proceed.    History of Present Illness: Joy Edwards is a 68 y.o. who identifies as a female who was assigned female at birth, and is being seen today for flu she caught from her mother while caring for her. Fever, chills, cough and body aches for 1 day. Requests tamilfu. 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   Bacitracin Other (See Comments)    Stinging sensation in and around eye   Bronopol Other (See Comments)    Known from allergy test done   Latex    Nickel Sulfate [Nickel] Other (See Comments)  Known from allergy test done   Medications:  Current Outpatient Medications:    oseltamivir (TAMIFLU) 75 MG capsule, Take 1 capsule (75 mg total) by mouth 2 (two) times daily for 5 days., Disp: 10 capsule, Rfl: 0   promethazine-dextromethorphan (PROMETHAZINE-DM) 6.25-15 MG/5ML syrup, Take 5 mLs by mouth 4 (four) times daily as needed for up to 10 days for cough., Disp: 118 mL, Rfl: 0   buPROPion (WELLBUTRIN XL) 300 MG 24 hr tablet, Take 300 mg  by mouth daily., Disp: , Rfl:    Cholecalciferol (VITAMIN D3) 25 MCG (1000 UT) CAPS, Take 1 capsule (1,000 Units total) by mouth daily., Disp: , Rfl:    clobetasol ointment (TEMOVATE) 0.05 %, , Disp: , Rfl:    sertraline (ZOLOFT) 50 MG tablet, Take 50 mg by mouth daily., Disp: , Rfl:    zinc sulfate 220 (50 Zn) MG capsule, Take 1 capsule (220 mg total) by mouth daily., Disp: 42 capsule, Rfl: 0  Observations/Objective: Patient is well-developed, well-nourished in no acute distress.  Resting comfortably  at home.  Head is normocephalic, atraumatic.  No labored breathing.  Speech is clear and coherent with logical content.  Patient is alert and oriented at baseline.    Assessment and Plan: 1. Influenza (Primary)  Increase fluids, humidifier at night, mucinex, urgent care if sx worsen.  Follow Up Instructions: I discussed the assessment and treatment plan with the patient. The patient was provided an opportunity to ask questions and all were answered. The patient agreed with the plan and demonstrated an understanding of the instructions.  A copy of instructions were sent to the patient via MyChart unless otherwise noted below.     The patient was advised to call back or seek an in-person evaluation if the symptoms worsen or if the condition fails to improve as anticipated.    Georgana Curio, FNP

## 2023-03-21 NOTE — Patient Instructions (Signed)

## 2023-04-05 DIAGNOSIS — M9904 Segmental and somatic dysfunction of sacral region: Secondary | ICD-10-CM | POA: Diagnosis not present

## 2023-04-05 DIAGNOSIS — M9901 Segmental and somatic dysfunction of cervical region: Secondary | ICD-10-CM | POA: Diagnosis not present

## 2023-04-05 DIAGNOSIS — M9903 Segmental and somatic dysfunction of lumbar region: Secondary | ICD-10-CM | POA: Diagnosis not present

## 2023-04-05 DIAGNOSIS — M545 Low back pain, unspecified: Secondary | ICD-10-CM | POA: Diagnosis not present

## 2023-04-20 ENCOUNTER — Telehealth: Payer: Self-pay | Admitting: Family Medicine

## 2023-04-20 NOTE — Telephone Encounter (Signed)
 Noted. Thanks.
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
## 2023-04-20 NOTE — Telephone Encounter (Signed)
 Patient dropped off document DMV, to be filled out by provider. Patient requested to send it back via Call Patient to pick up within 5-days. Document is located in providers tray at front office.Please advise at Mobile [phone] (mobile)

## 2023-04-21 DIAGNOSIS — M9903 Segmental and somatic dysfunction of lumbar region: Secondary | ICD-10-CM | POA: Diagnosis not present

## 2023-04-21 DIAGNOSIS — M9904 Segmental and somatic dysfunction of sacral region: Secondary | ICD-10-CM | POA: Diagnosis not present

## 2023-04-21 DIAGNOSIS — M545 Low back pain, unspecified: Secondary | ICD-10-CM | POA: Diagnosis not present

## 2023-04-21 DIAGNOSIS — M9901 Segmental and somatic dysfunction of cervical region: Secondary | ICD-10-CM | POA: Diagnosis not present

## 2023-04-25 ENCOUNTER — Encounter: Payer: Self-pay | Admitting: Family Medicine

## 2023-04-26 ENCOUNTER — Encounter: Payer: Self-pay | Admitting: Family Medicine

## 2023-04-26 ENCOUNTER — Ambulatory Visit (INDEPENDENT_AMBULATORY_CARE_PROVIDER_SITE_OTHER): Admitting: Family Medicine

## 2023-04-26 VITALS — BP 102/64 | HR 70 | Temp 97.9°F | Ht 65.0 in | Wt 116.0 lb

## 2023-04-26 DIAGNOSIS — Z029 Encounter for administrative examinations, unspecified: Secondary | ICD-10-CM

## 2023-04-26 DIAGNOSIS — M9903 Segmental and somatic dysfunction of lumbar region: Secondary | ICD-10-CM | POA: Diagnosis not present

## 2023-04-26 DIAGNOSIS — M9901 Segmental and somatic dysfunction of cervical region: Secondary | ICD-10-CM | POA: Diagnosis not present

## 2023-04-26 DIAGNOSIS — M9904 Segmental and somatic dysfunction of sacral region: Secondary | ICD-10-CM | POA: Diagnosis not present

## 2023-04-26 DIAGNOSIS — M545 Low back pain, unspecified: Secondary | ICD-10-CM | POA: Diagnosis not present

## 2023-04-26 NOTE — Patient Instructions (Signed)
Take care.  Glad to see you.  
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
## 2023-04-28 DIAGNOSIS — Z029 Encounter for administrative examinations, unspecified: Secondary | ICD-10-CM | POA: Insufficient documentation

## 2023-04-28 NOTE — Assessment & Plan Note (Signed)
 I do not see any evidence of any medical issue (or any other issue) that would prevent the patient from driving.  See scanned forms.

## 2023-05-05 ENCOUNTER — Telehealth: Payer: Self-pay

## 2023-05-05 DIAGNOSIS — M545 Low back pain, unspecified: Secondary | ICD-10-CM | POA: Diagnosis not present

## 2023-05-05 DIAGNOSIS — M9901 Segmental and somatic dysfunction of cervical region: Secondary | ICD-10-CM | POA: Diagnosis not present

## 2023-05-05 DIAGNOSIS — M9903 Segmental and somatic dysfunction of lumbar region: Secondary | ICD-10-CM | POA: Diagnosis not present

## 2023-05-05 DIAGNOSIS — M9904 Segmental and somatic dysfunction of sacral region: Secondary | ICD-10-CM | POA: Diagnosis not present

## 2023-05-05 NOTE — Telephone Encounter (Signed)
 Spoke with patient and advised DMV forms are available for pickup

## 2023-05-14 ENCOUNTER — Telehealth: Payer: Self-pay | Admitting: Family Medicine

## 2023-05-14 NOTE — Telephone Encounter (Signed)
 Copied from CRM [id number]. Topic: General - Other >> May 14, 2023  2:09 PM Gurney Maxin H wrote: Reason for CRM: Patient was just calling to let Dr. Para March know she appreciates the call with condolences for her mother, states she really appreciates him taking the time out of his busy schedule to call her. No callback necessary.

## 2023-05-14 NOTE — Telephone Encounter (Signed)
 Thank you for the update!

## 2023-05-26 DIAGNOSIS — M9901 Segmental and somatic dysfunction of cervical region: Secondary | ICD-10-CM | POA: Diagnosis not present

## 2023-05-26 DIAGNOSIS — M9903 Segmental and somatic dysfunction of lumbar region: Secondary | ICD-10-CM | POA: Diagnosis not present

## 2023-05-26 DIAGNOSIS — M9904 Segmental and somatic dysfunction of sacral region: Secondary | ICD-10-CM | POA: Diagnosis not present

## 2023-05-26 DIAGNOSIS — M545 Low back pain, unspecified: Secondary | ICD-10-CM | POA: Diagnosis not present

## 2023-05-28 ENCOUNTER — Ambulatory Visit: Payer: PPO

## 2023-05-28 VITALS — Ht 65.0 in | Wt 116.0 lb

## 2023-05-28 DIAGNOSIS — Z1231 Encounter for screening mammogram for malignant neoplasm of breast: Secondary | ICD-10-CM

## 2023-05-28 DIAGNOSIS — Z0000 Encounter for general adult medical examination without abnormal findings: Secondary | ICD-10-CM | POA: Diagnosis not present

## 2023-05-28 NOTE — Patient Instructions (Addendum)
 Joy Edwards , Thank you for taking time to come for your Medicare Wellness Visit. I appreciate your ongoing commitment to your health goals. Please review the following plan we discussed and let me know if I can assist you in the future.   Referrals/Orders/Follow-Ups/Clinician Recommendations:   You have an order for:  []   2D Mammogram  [x]   3D Mammogram  []   Bone Density     Please call for appointment: Due 06/11/23 or after  [facility] [address] [place], [place] [address] [phone]   Make sure to wear two-piece clothing.  No lotions, powders, or deodorants the day of the appointment. Make sure to bring picture ID and insurance card.  Bring list of medications you are currently taking including any supplements.    This is a list of the screening recommended for you and due dates:  Health Maintenance  Topic Date Due   Flu Shot  09/03/2023   Medicare Annual Wellness Visit  05/27/2024   Mammogram  06/09/2024   Colon Cancer Screening  07/29/2027   DTaP/Tdap/Td vaccine (3 - Td or Tdap) 11/16/2029   Pneumonia Vaccine  Completed   DEXA scan (bone density measurement)  Completed   COVID-19 Vaccine  Completed   Hepatitis C Screening  Completed   Zoster (Shingles) Vaccine  Completed   HPV Vaccine  Aged Out   Meningitis B Vaccine  Aged Out    Advanced directives: (Copy Requested) Please bring a copy of your health care power of attorney and living will to the office to be added to your chart at your convenience. You can mail to [facility] [address] [place], [place] [address] or email to [email or web address]  Next Medicare Annual Wellness Visit scheduled for next year: Yes 05/30/24 @ 1pm televisit

## 2023-05-28 NOTE — Progress Notes (Signed)
 Subjective:   Joy Edwards is a 68 y.o. who presents for a Medicare Wellness preventive visit.  Visit Complete: Virtual I connected with  [facility] on 05/28/23 by a audio enabled telemedicine application and verified that I am speaking with the correct person using two identifiers.  Patient Location: Home  Provider Location: Office/Clinic  I discussed the limitations of evaluation and management by telemedicine. The patient expressed understanding and agreed to proceed.  Vital Signs: Because this visit was a virtual/telehealth visit, some criteria may be missing or patient reported. Any vitals not documented were not able to be obtained and vitals that have been documented are patient reported.  VideoDeclined- This patient declined Librarian, academic. Therefore the visit was completed with audio only.  Persons Participating in Visit: Patient.  AWV Questionnaire: Yes: Patient Medicare AWV questionnaire was completed by the patient on 05/27/23; I have confirmed that all information answered by patient is correct and no changes since this date.  Cardiac Risk Factors include: advanced age (>76men, >10 women)    Objective:    Today's Vitals   05/27/23 1429 05/28/23 1030  Weight:  116 lb (52.6 kg)  Height:  5\' 5"  (1.651 m)  PainSc: 5     Body mass index is 19.3 kg/m.     05/28/2023   10:48 AM 06/02/2022    3:25 PM 06/11/2021   10:31 AM 10/29/2020    9:42 AM 08/19/2020    9:55 AM 09/28/2019    8:57 AM 06/21/2019   10:42 AM  Advanced Directives  Does Patient Have a Medical Advance Directive? Yes Yes Yes Yes Yes Yes Yes  Type of Estate agent of [place];Living will Living will;Healthcare Power of [employer] Power of [place];Living will Healthcare Power of [place];Living will;Out of facility DNR (pink MOST or yellow form) Healthcare Power of [place];Living will Healthcare Power of [place];Living will Healthcare  Power of [place];Living will  Does patient want to make changes to medical advance directive?    No - Patient declined No - Patient declined No - Patient declined No - Patient declined  Copy of Healthcare Power of Attorney in Chart? No - copy requested Yes - validated most recent copy scanned in chart (See row information) No - copy requested No - copy requested No - copy requested  No - copy requested    Current Medications (verified) Outpatient Encounter Medications as of 05/28/2023  Medication Sig   buPROPion (WELLBUTRIN XL) 300 MG 24 hr tablet Take 300 mg by mouth daily.   Cholecalciferol (VITAMIN D3) 25 MCG (1000 UT) CAPS Take 1 capsule (1,000 Units total) by mouth daily.   clobetasol ointment (TEMOVATE) 0.05 %    sertraline (ZOLOFT) 50 MG tablet Take 50 mg by mouth daily.   zinc  sulfate 220 (50 Zn) MG capsule Take 1 capsule (220 mg total) by mouth daily.   No facility-administered encounter medications on file as of 05/28/2023.    Allergies (verified) Alendronate , Bacitracin, Bronopol, Latex, and Nickel sulfate [nickel]   History: Past Medical History:  Diagnosis Date   Depression    Osteoporosis    Skin cancer    Past Surgical History:  Procedure Laterality Date   APPENDECTOMY     ORIF ELBOW FRACTURE Left 08/23/2020   Procedure: OPEN REDUCTION INTERNAL FIXATION (ORIF) LEFT ELBOW/OLECRANON FRACTURE;  Surgeon: Wes Hamman, MD;  Location: [facility];  Service: Orthopedics;  Laterality: Left;   Family History  Problem Relation Age of Onset  Anemia Mother    Alcohol abuse Father    Alcohol abuse Sister    Colon cancer Neg Hx    Breast cancer Neg Hx    Social History   Socioeconomic History   Marital status: Married    Spouse name: Courtenay Distel   Number of children: 2   Years of education: Not on file   Highest education level: Not on file  Occupational History   Not on file  Tobacco Use   Smoking status: Never   Smokeless tobacco: Never  Vaping Use    Vaping status: Never Used  Substance and Sexual Activity   Alcohol use: Not Currently    Comment: wine, 1 a day   Drug use: No   Sexual activity: Yes  Other Topics Concern   Not on file  Social History Narrative   Married 1977   Has chickens, goats, horses and 100 acres.     2 daughters   2 granddaughters, age 85 & 7 in 2023.   Social Drivers of Corporate investment banker Strain: Low Risk  (05/28/2023)   Overall Financial Resource Strain (CARDIA)    Difficulty of Paying Living Expenses: Not hard at all  Food Insecurity: No Food Insecurity (05/28/2023)   Hunger Vital Sign    Worried About Running Out of Food in the Last Year: Never true    Ran Out of Food in the Last Year: Never true  Transportation Needs: No Transportation Needs (05/28/2023)   PRAPARE - Administrator, Civil Service (Medical): No    Lack of Transportation (Non-Medical): No  Physical Activity: Sufficiently Active (05/28/2023)   Exercise Vital Sign    Days of Exercise per Week: 5 days    Minutes of Exercise per Session: 30 min  Stress: No Stress Concern Present (05/28/2023)   [employer] of Occupational Health - Occupational Stress Questionnaire    Feeling of Stress : Only a little  Social Connections: Moderately Integrated (05/28/2023)   Social Connection and Isolation Panel [NHANES]    Frequency of Communication with Friends and Family: More than three times a week    Frequency of Social Gatherings with Friends and Family: More than three times a week    Attends Religious Services: More than 4 times per year    Active Member of [employer] or Organizations: No    Attends Engineer, structural: Never    Marital Status: Married    Tobacco Counseling Counseling given: Not Answered    Clinical Intake:  Pre-visit preparation completed: Yes  Pain : 0-10 Pain Score: 5  Pain Type: Chronic pain Pain Location: Buttocks Pain Orientation: Left Pain Descriptors / Indicators: Aching,  Sharp Pain Onset: More than a month ago Pain Frequency: Intermittent Pain Relieving Factors: walking,heat  Pain Relieving Factors: walking,heat  BMI - recorded: 19.3 Nutritional Status: BMI of 19-24  Normal Nutritional Risks: None Diabetes: No  No results found for: "HGBA1C"   How often do you need to have someone help you when you read instructions, pamphlets, or other written materials from your doctor or pharmacy?: 1 - Never  Interpreter Needed?: No  Comments: lives with husband Information entered by :: B.Calise Dunckel,LPN   Activities of Daily Living     05/27/2023    2:29 PM 06/02/2022    3:27 PM  In your present state of health, do you have any difficulty performing the following activities:  Hearing? 0 0  Vision? 0 0  Difficulty concentrating or making decisions? 0 0  Walking or climbing stairs? 0 0  Dressing or bathing? 0 0  Doing errands, shopping? 0 0  Preparing Food and eating ? N N  Using the Toilet? N N  In the past six months, have you accidently leaked urine? N Y  Do you have problems with loss of bowel control? N N  Managing your Medications? N N  Managing your Finances? N N  Housekeeping or managing your Housekeeping? N N    Patient Care Team: Donnie Galea, MD as PCP - General (Family Medicine) [facility], [facility]  Indicate any recent Medical Services you may have received from other than [facility] providers in the past year (date may be approximate).     Assessment:   This is a routine wellness examination for Shakoya.  Hearing/Vision screen Hearing Screening - Comments:: Pt says her hearing is great /hearing aids Vision Screening - Comments:: Pt says her vision is good w/bifocals [name]   Goals Addressed               This Visit's Progress     COMPLETED: I'd like to walk more when my leg is feeling better (pt-stated)   On track     Patient Stated        I want to have more fun        Depression Screen     05/28/2023    10:43 AM 04/26/2023   12:46 PM 06/22/2022   10:36 AM 06/02/2022    3:27 PM 06/02/2022    3:24 PM 06/11/2021   10:29 AM 12/12/2020    4:15 PM  PHQ 2/9 Scores  PHQ - 2 Score 0 0 0 0 0 0 0  PHQ- 9 Score  0 0    1    Fall Risk     05/27/2023    2:29 PM 04/26/2023   12:46 PM 06/22/2022   10:36 AM 06/02/2022    3:27 PM 06/11/2021   10:31 AM  Fall Risk   Falls in the past year? 0 0 0 0 1  Number falls in past yr:  0 0 0 0  Injury with Fall?  0 0 0 0  Risk for fall due to : No Fall Risks No Fall Risks No Fall Risks No Fall Risks History of fall(s);Impaired balance/gait  Follow up Education provided;Falls prevention discussed Falls evaluation completed Falls evaluation completed Falls prevention discussed;Falls evaluation completed;Education provided Falls prevention discussed    MEDICARE RISK AT HOME:  Medicare Risk at Home Any stairs in or around the home?: (Patient-Rptd) Yes If so, are there any without handrails?: (Patient-Rptd) No Home free of loose throw rugs in walkways, pet beds, electrical cords, etc?: (Patient-Rptd) Yes Adequate lighting in your home to reduce risk of falls?: (Patient-Rptd) Yes Life alert?: (Patient-Rptd) No Use of a cane, walker or w/c?: (Patient-Rptd) No Grab bars in the bathroom?: (Patient-Rptd) No Shower chair or bench in shower?: (Patient-Rptd) Yes Elevated toilet seat or a handicapped toilet?: (Patient-Rptd) No  TIMED UP AND GO:  Was the test performed?  No  Cognitive Function: 6CIT completed    06/21/2019   10:54 AM  MMSE - Mini Mental State Exam  Not completed: Unable to complete        05/28/2023   10:51 AM 06/02/2022    3:36 PM 06/11/2021   10:34 AM  6CIT Screen  What Year? 0 points 0 points 0 points  What month? 0 points 0 points 0 points  What time? 0  points 0 points 0 points  Count back from 20 0 points 0 points 0 points  Months in reverse 0 points 0 points 0 points  Repeat phrase 0 points 0 points 0 points  Total Score 0 points 0  points 0 points    Immunizations Immunization History  Administered Date(s) Administered   Fluad Quad(high Dose 65+) 12/06/2018, 11/15/2021   Influenza Split 10/27/2010   Influenza, High Dose Seasonal PF 11/15/2021, 10/09/2022   Influenza,inj,Quad PF,6+ Mos 03/27/2014   Influenza-Unspecified 12/09/2012, 09/07/2019, 10/30/2020   Moderna Covid-19 Fall Seasonal Vaccine 40yrs & older 11/15/2021   PFIZER Comirnaty(Gray Top)Covid-19 Tri-Sucrose Vaccine 05/23/2020   PFIZER(Purple Top)SARS-COV-2 Vaccination 02/21/2019, 03/11/2019, 11/08/2019, 05/23/2020   Pfizer Covid-19 Vaccine Bivalent Booster 39yrs & up 10/30/2020   Pfizer(Comirnaty)Fall Seasonal Vaccine 12 years and older 10/09/2022, 04/20/2023   Pneumococcal Conjugate-13 07/25/2020   Pneumococcal Polysaccharide-23 06/23/2022   Tdap 03/27/2014, 11/17/2019   Zoster Recombinant(Shingrix) 04/19/2017, 07/14/2017    Screening Tests Health Maintenance  Topic Date Due   INFLUENZA VACCINE  09/03/2023   Medicare Annual Wellness (AWV)  05/27/2024   MAMMOGRAM  06/09/2024   Colonoscopy  07/29/2027   DTaP/Tdap/Td (3 - Td or Tdap) 11/16/2029   Pneumonia Vaccine 58+ Years old  Completed   DEXA SCAN  Completed   COVID-19 Vaccine  Completed   Hepatitis C Screening  Completed   Zoster Vaccines- Shingrix  Completed   HPV VACCINES  Aged Out   Meningococcal B Vaccine  Aged Out    Health Maintenance  There are no preventive care reminders to display for this patient.  Health Maintenance Items Addressed: Mammogram ordered  Additional Screening:  Vision Screening: Recommended annual ophthalmology exams for early detection of glaucoma and other disorders of the eye.  Dental Screening: Recommended annual dental exams for proper oral hygiene  Community Resource Referral / Chronic Care Management: CRR required this visit?  No   CCM required this visit?  No Pt declined to schedule at this time. She says she will schedule in my chart for October  per her conversation w/PCP     Plan:     I have personally reviewed and noted the following in the patient's chart:   Medical and social history Use of alcohol, tobacco or illicit drugs  Current medications and supplements including opioid prescriptions. Patient is not currently taking opioid prescriptions. Functional ability and status Nutritional status Physical activity Advanced directives List of other physicians Hospitalizations, surgeries, and ER visits in previous 12 months Vitals Screenings to include cognitive, depression, and falls Referrals and appointments  In addition, I have reviewed and discussed with patient certain preventive protocols, quality metrics, and best practice recommendations. A written personalized care plan for preventive services as well as general preventive health recommendations were provided to patient.    Nerissa Bannister, LPN   1/61/0960   After Visit Summary: (MyChart) Due to this being a telephonic visit, the after visit summary with patients personalized plan was offered to patient via MyChart   Notes: Nothing significant to report at this time.

## 2023-06-02 ENCOUNTER — Ambulatory Visit: Payer: Self-pay

## 2023-06-02 NOTE — Telephone Encounter (Signed)
 Noted. Thanks.

## 2023-06-02 NOTE — Telephone Encounter (Signed)
  Chief Complaint: Left back/Buttock Pain Symptoms: left buttock pain Frequency: 2-3 weeks Pertinent Negatives: Patient denies CP, SOB Disposition: [] ED /[] Urgent Care (no appt availability in office) / [x] Appointment(In office/virtual)/ []  [facility]/ [] Home Care/ [] Refused Recommended Disposition /[] [facility]/ []  Follow-up with PCP Additional Notes: patient calling with concerns for left buttock pain that has been going on for 2-3 weeks. Patient reports hx of muscles pulling from right side of hip. Patient is concerned with how long the pain has been going on. Patient states pain is worse when she is sitting for long periods of time. Patient endorses she and her husband have been traveling. Per protocol, patient is recommended to be seen in the office. Patient is requesting to see her PCP. Appointment scheduled for 06/04/2023 at 9:00 AM. Patient verbalized understanding of plan and all questions answered.    Copied from CRM [id number]. Topic: Clinical - Red Word Triage >> Jun 02, 2023  1:07 PM Joy Edwards wrote: [facility] that prompted transfer to Nurse Triage: Patient called in stating she has severe pain on her buttock, believes its her muscles , hurts to sit Reason for Disposition  [1] MODERATE back pain (e.g., interferes with normal activities) AND [2] present > 3 days  Answer Assessment - Initial Assessment Questions 1. ONSET: "When did the pain begin?"      2-3 weeks 2. LOCATION: "Where does it hurt?" (upper, mid or lower back)     Left buttock pain 3. SEVERITY: "How bad is the pain?"  (e.g., Scale 1-10; mild, moderate, or severe)   - MILD (1-3): Doesn't interfere with normal activities.    - MODERATE (4-7): Interferes with normal activities or awakens from sleep.    - SEVERE (8-10): Excruciating pain, unable to do any normal activities.      7-8 out of 10 4. PATTERN: "Is the pain constant?" (e.g., yes, no; constant, intermittent)      constant 5. RADIATION: "Does  the pain shoot into your legs or somewhere else?"     no 6. CAUSE:  "What do you think is causing the back pain?"      Hx of muscles pulling from left side of hip. 7. BACK OVERUSE:  "Any recent lifting of heavy objects, strenuous work or exercise?"     no 8. MEDICINES: "What have you taken so far for the pain?" (e.g., nothing, acetaminophen , NSAIDS)    9. NEUROLOGIC SYMPTOMS: "Do you have any weakness, numbness, or problems with bowel/bladder control?"     no 10. OTHER SYMPTOMS: "Do you have any other symptoms?" (e.g., fever, abdomen pain, burning with urination, blood in urine)       no  Protocols used: Back Pain-A-AH

## 2023-06-03 DIAGNOSIS — L570 Actinic keratosis: Secondary | ICD-10-CM | POA: Diagnosis not present

## 2023-06-04 ENCOUNTER — Ambulatory Visit (INDEPENDENT_AMBULATORY_CARE_PROVIDER_SITE_OTHER): Admitting: Family Medicine

## 2023-06-04 ENCOUNTER — Encounter: Payer: Self-pay | Admitting: Family Medicine

## 2023-06-04 ENCOUNTER — Ambulatory Visit (INDEPENDENT_AMBULATORY_CARE_PROVIDER_SITE_OTHER)
Admission: RE | Admit: 2023-06-04 | Discharge: 2023-06-04 | Disposition: A | Discharge status: Home / Self Care | Source: Ambulatory Visit | Attending: Family Medicine | Admitting: Family Medicine

## 2023-06-04 VITALS — BP 116/64 | HR 74 | Temp 97.9°F | Ht 65.0 in | Wt 116.0 lb

## 2023-06-04 DIAGNOSIS — M7918 Myalgia, other site: Secondary | ICD-10-CM | POA: Diagnosis not present

## 2023-06-04 DIAGNOSIS — R102 Pelvic and perineal pain: Secondary | ICD-10-CM | POA: Diagnosis not present

## 2023-06-04 DIAGNOSIS — E785 Hyperlipidemia, unspecified: Secondary | ICD-10-CM

## 2023-06-04 DIAGNOSIS — M810 Age-related osteoporosis without current pathological fracture: Secondary | ICD-10-CM

## 2023-06-04 LAB — COMPREHENSIVE METABOLIC PANEL WITH GFR
ALT: 21 U/L (ref 0–35)
AST: 22 U/L (ref 0–37)
Albumin: 4.8 g/dL (ref 3.5–5.2)
Alkaline Phosphatase: 73 U/L (ref 39–117)
BUN: 14 mg/dL (ref 6–23)
CO2: 31 meq/L (ref 19–32)
Calcium: 9.9 mg/dL (ref 8.4–10.5)
Chloride: 100 meq/L (ref 96–112)
Creatinine, Ser: 0.78 mg/dL (ref 0.40–1.20)
GFR: 77.22 mL/min (ref >60.00)
Glucose, Bld: 88 mg/dL (ref 70–99)
Potassium: 5 meq/L (ref 3.5–5.1)
Sodium: 137 meq/L (ref 135–145)
Total Bilirubin: 0.4 mg/dL (ref 0.2–1.2)
Total Protein: 7.1 g/dL (ref 6.0–8.3)

## 2023-06-04 LAB — LIPID PANEL
Cholesterol: 218 mg/dL — ABNORMAL HIGH (ref 0–200)
HDL: 83.3 mg/dL (ref >39.00)
LDL Cholesterol: 122 mg/dL — ABNORMAL HIGH (ref 0–99)
NonHDL: 134.71
Total CHOL/HDL Ratio: 3
Triglycerides: 62 mg/dL (ref 0.0–149.0)
VLDL: 12.4 mg/dL (ref 0.0–40.0)

## 2023-06-04 LAB — VITAMIN D 25 HYDROXY (VIT D DEFICIENCY, FRACTURES): VITD: 72.77 ng/mL (ref 30.00–100.00)

## 2023-06-04 LAB — TSH: TSH: 2.84 u[IU]/mL (ref 0.35–5.50)

## 2023-06-04 NOTE — Progress Notes (Unsigned)
 Per patient report 3 years ago she had an avulsion fracture on R with a fall.  She saw ortho about that.  It finally got better.    About 1 month ago, she had the same feeling on the L side.  No falls.  No other trauma. She has been doing some heavy lifting prior to sx onset.  Pain doesn't radiate into the L leg.  Pain going up and down stairs.  Pain sitting.  Standing is better than sitting.  L buttock pain, not midline pain. Aleve helped the pain.  Nsaid cautions d/w pt.    We talked about IV infusions re: bone density.   Cautions d/w pt.  She has dysphagia with fosamax .    D/w pt about checking labs and then see about infusion.   Meds, vitals, and allergies reviewed.   ROS: Per HPI unless specifically indicated in ROS section

## 2023-06-04 NOTE — Patient Instructions (Signed)
 Go to the lab on the way out.   If you have mychart we'll likely use that to update you.    Take care.  Glad to see you. Take aleve with food in the meantime.

## 2023-06-05 LAB — CBC WITH DIFFERENTIAL/PLATELET
Basophils Absolute: 0 10*3/uL (ref 0.0–0.1)
Basophils Relative: 1.1 % (ref 0.0–3.0)
Eosinophils Absolute: 0.2 10*3/uL (ref 0.0–0.7)
Eosinophils Relative: 4.6 % (ref 0.0–5.0)
HCT: 41.8 % (ref 36.0–46.0)
Hemoglobin: 14 g/dL (ref 12.0–15.0)
Lymphocytes Relative: 36.8 % (ref 12.0–46.0)
Lymphs Abs: 1.6 10*3/uL (ref 0.7–4.0)
MCHC: 33.5 g/dL (ref 30.0–36.0)
MCV: 91.6 fl (ref 78.0–100.0)
Monocytes Absolute: 0.5 10*3/uL (ref 0.1–1.0)
Monocytes Relative: 10.9 % (ref 3.0–12.0)
Neutro Abs: 2 10*3/uL (ref 1.4–7.7)
Neutrophils Relative %: 46.6 % (ref 43.0–77.0)
Platelets: 253 10*3/uL (ref 150.0–400.0)
RBC: 4.57 Mil/uL (ref 3.87–5.11)
RDW: 13.9 % (ref 11.5–15.5)
WBC: 4.4 10*3/uL (ref 4.0–10.5)

## 2023-06-06 ENCOUNTER — Encounter: Payer: Self-pay | Admitting: Family Medicine

## 2023-06-06 ENCOUNTER — Telehealth: Payer: Self-pay | Admitting: Family Medicine

## 2023-06-06 DIAGNOSIS — M7918 Myalgia, other site: Secondary | ICD-10-CM | POA: Insufficient documentation

## 2023-06-06 DIAGNOSIS — E785 Hyperlipidemia, unspecified: Secondary | ICD-10-CM | POA: Insufficient documentation

## 2023-06-06 NOTE — Telephone Encounter (Signed)
 Can you please talk to me about potentially starting this patient on yearly zoledronic acid infusions for osteoporosis?  Thanks.

## 2023-06-06 NOTE — Assessment & Plan Note (Signed)
 See notes on labs.

## 2023-06-06 NOTE — Assessment & Plan Note (Signed)
 We talked about IV infusions re: bone density.   Cautions d/w pt.  She has dysphagia with fosamax .   D/w pt about checking labs and then considering treatment with infusion.  See notes on labs.

## 2023-06-06 NOTE — Assessment & Plan Note (Signed)
 She could have a pulled muscle.  See notes on imaging. Take aleve with food in the meantime.  Update me as needed.

## 2023-06-08 DIAGNOSIS — M545 Low back pain, unspecified: Secondary | ICD-10-CM | POA: Diagnosis not present

## 2023-06-08 DIAGNOSIS — M9901 Segmental and somatic dysfunction of cervical region: Secondary | ICD-10-CM | POA: Diagnosis not present

## 2023-06-08 DIAGNOSIS — M9904 Segmental and somatic dysfunction of sacral region: Secondary | ICD-10-CM | POA: Diagnosis not present

## 2023-06-08 DIAGNOSIS — M9903 Segmental and somatic dysfunction of lumbar region: Secondary | ICD-10-CM | POA: Diagnosis not present

## 2023-06-10 NOTE — Telephone Encounter (Signed)
 Very sorry I didn't get a chance to talk to you about this when you were in the office. This is something that would be referred to the infusion center for. I am not 100% the steps on how to get it done but happy to look into if for you. Did you need some specific information for the patient I can get  for her?

## 2023-06-10 NOTE — Telephone Encounter (Signed)
 I apologize, I thought I had to get the infusion orders set up through you.  Do you know who I need to contact or how to get the orders set up?  And I thank you for your effort.

## 2023-06-10 NOTE — Telephone Encounter (Signed)
 Patient was asking about options on this.  Please let me know what I need to do in terms of the order set.  Thanks.

## 2023-06-11 NOTE — Telephone Encounter (Signed)
 Called patient verified she would like to go to Infusion center on [address]. I have put order in your box when you sign please give back to me so I can move on to the next step.

## 2023-06-15 ENCOUNTER — Other Ambulatory Visit: Payer: Self-pay

## 2023-06-15 DIAGNOSIS — M810 Age-related osteoporosis without current pathological fracture: Secondary | ICD-10-CM | POA: Insufficient documentation

## 2023-06-15 NOTE — Telephone Encounter (Signed)
 Have you filled out ppw? Have not received yet?

## 2023-06-15 NOTE — Telephone Encounter (Signed)
 I signed it but I gave it to AV.  I routed this to her in the meantime.  Thanks.

## 2023-06-15 NOTE — Telephone Encounter (Signed)
 Document has been faxed. Given to Joellen for her to finish her part.

## 2023-06-16 ENCOUNTER — Telehealth: Payer: Self-pay | Admitting: Pharmacy Technician

## 2023-06-16 NOTE — Telephone Encounter (Signed)
 Auth Submission: NO AUTH NEEDED Site of care: Site of care: [facility] Payer: [employer] ADVT Medication & CPT/J Code(s) submitted: Reclast (Zolendronic acid) [id number] Route of submission (phone, fax, portal):  Phone # Fax # Auth type: Buy/Bill PB Units/visits requested: X1 DOSE Reference number:  Approval from: 06/16/23 to 02/02/24

## 2023-06-21 ENCOUNTER — Ambulatory Visit: Payer: Self-pay | Admitting: Family Medicine

## 2023-06-23 ENCOUNTER — Ambulatory Visit (INDEPENDENT_AMBULATORY_CARE_PROVIDER_SITE_OTHER)

## 2023-06-23 VITALS — BP 144/76 | HR 62 | Temp 97.4°F | Resp 16 | Ht 64.5 in | Wt 119.0 lb

## 2023-06-23 DIAGNOSIS — M810 Age-related osteoporosis without current pathological fracture: Secondary | ICD-10-CM | POA: Diagnosis not present

## 2023-06-23 MED ORDER — SODIUM CHLORIDE 0.9 % IV SOLN: INTRAVENOUS | Status: DC

## 2023-06-23 MED ORDER — ZOLEDRONIC ACID 5 MG/100ML IV SOLN
5.0000 mg | Freq: Once | INTRAVENOUS | Status: AC
  Administered 2023-06-23: 5 mg via INTRAVENOUS
  Filled 2023-06-23: qty 100

## 2023-06-23 MED ORDER — DIPHENHYDRAMINE HCL 25 MG PO CAPS
25.0000 mg | ORAL_CAPSULE | Freq: Once | ORAL | Status: AC
  Administered 2023-06-23: 25 mg via ORAL
  Filled 2023-06-23: qty 1

## 2023-06-23 MED ORDER — ACETAMINOPHEN 325 MG PO TABS
650.0000 mg | ORAL_TABLET | Freq: Once | ORAL | Status: AC
  Administered 2023-06-23: 650 mg via ORAL
  Filled 2023-06-23: qty 2

## 2023-06-23 NOTE — Progress Notes (Signed)
 Diagnosis: Osteoporosis  Provider:  Phyllis Breeze MD  Procedure: IV Infusion  IV Type: Peripheral, IV Location: L Antecubital  Reclast (Zolendronic Acid), Dose: 5 mg  Infusion Start Time: 1131  Infusion Stop Time: 1201  Post Infusion IV Care: Observation period completed and Peripheral IV Discontinued  Discharge: Condition: Good, Destination: Home . AVS Provided  Performed by:  Lizet Kelso, RN

## 2023-07-19 DIAGNOSIS — Z1231 Encounter for screening mammogram for malignant neoplasm of breast: Secondary | ICD-10-CM | POA: Diagnosis not present

## 2023-07-19 LAB — HM MAMMOGRAPHY

## 2023-07-20 ENCOUNTER — Encounter: Payer: Self-pay | Admitting: Family Medicine

## 2023-07-22 ENCOUNTER — Ambulatory Visit: Payer: Self-pay | Admitting: Family Medicine

## 2023-07-30 DIAGNOSIS — D0461 Carcinoma in situ of skin of right upper limb, including shoulder: Secondary | ICD-10-CM | POA: Diagnosis not present

## 2023-07-30 DIAGNOSIS — Z85828 Personal history of other malignant neoplasm of skin: Secondary | ICD-10-CM | POA: Diagnosis not present

## 2023-07-30 DIAGNOSIS — L905 Scar conditions and fibrosis of skin: Secondary | ICD-10-CM | POA: Diagnosis not present

## 2023-07-30 DIAGNOSIS — D225 Melanocytic nevi of trunk: Secondary | ICD-10-CM | POA: Diagnosis not present

## 2023-07-30 DIAGNOSIS — D0462 Carcinoma in situ of skin of left upper limb, including shoulder: Secondary | ICD-10-CM | POA: Diagnosis not present

## 2023-07-30 DIAGNOSIS — D2261 Melanocytic nevi of right upper limb, including shoulder: Secondary | ICD-10-CM | POA: Diagnosis not present

## 2023-07-30 DIAGNOSIS — D2272 Melanocytic nevi of left lower limb, including hip: Secondary | ICD-10-CM | POA: Diagnosis not present

## 2023-07-30 DIAGNOSIS — C44622 Squamous cell carcinoma of skin of right upper limb, including shoulder: Secondary | ICD-10-CM | POA: Diagnosis not present

## 2023-07-30 DIAGNOSIS — D2262 Melanocytic nevi of left upper limb, including shoulder: Secondary | ICD-10-CM | POA: Diagnosis not present

## 2023-07-30 DIAGNOSIS — D485 Neoplasm of uncertain behavior of skin: Secondary | ICD-10-CM | POA: Diagnosis not present

## 2023-09-08 DIAGNOSIS — D0462 Carcinoma in situ of skin of left upper limb, including shoulder: Secondary | ICD-10-CM | POA: Diagnosis not present

## 2023-09-08 DIAGNOSIS — D0461 Carcinoma in situ of skin of right upper limb, including shoulder: Secondary | ICD-10-CM | POA: Diagnosis not present

## 2023-10-07 DIAGNOSIS — C44622 Squamous cell carcinoma of skin of right upper limb, including shoulder: Secondary | ICD-10-CM | POA: Diagnosis not present

## 2023-10-07 DIAGNOSIS — L905 Scar conditions and fibrosis of skin: Secondary | ICD-10-CM | POA: Diagnosis not present

## 2023-10-21 DIAGNOSIS — Z4802 Encounter for removal of sutures: Secondary | ICD-10-CM | POA: Diagnosis not present

## 2023-11-12 DIAGNOSIS — M5459 Other low back pain: Secondary | ICD-10-CM | POA: Diagnosis not present

## 2023-11-12 DIAGNOSIS — M25551 Pain in right hip: Secondary | ICD-10-CM | POA: Diagnosis not present

## 2023-11-12 DIAGNOSIS — M25522 Pain in left elbow: Secondary | ICD-10-CM | POA: Diagnosis not present

## 2023-11-16 DIAGNOSIS — M25522 Pain in left elbow: Secondary | ICD-10-CM | POA: Diagnosis not present

## 2023-11-16 DIAGNOSIS — M5459 Other low back pain: Secondary | ICD-10-CM | POA: Diagnosis not present

## 2023-11-16 DIAGNOSIS — M25551 Pain in right hip: Secondary | ICD-10-CM | POA: Diagnosis not present

## 2023-11-18 DIAGNOSIS — M25551 Pain in right hip: Secondary | ICD-10-CM | POA: Diagnosis not present

## 2023-11-18 DIAGNOSIS — M25522 Pain in left elbow: Secondary | ICD-10-CM | POA: Diagnosis not present

## 2023-11-18 DIAGNOSIS — M5459 Other low back pain: Secondary | ICD-10-CM | POA: Diagnosis not present

## 2023-11-23 DIAGNOSIS — M25522 Pain in left elbow: Secondary | ICD-10-CM | POA: Diagnosis not present

## 2023-11-23 DIAGNOSIS — M25551 Pain in right hip: Secondary | ICD-10-CM | POA: Diagnosis not present

## 2023-11-23 DIAGNOSIS — M5459 Other low back pain: Secondary | ICD-10-CM | POA: Diagnosis not present

## 2023-11-25 DIAGNOSIS — M25522 Pain in left elbow: Secondary | ICD-10-CM | POA: Diagnosis not present

## 2023-11-25 DIAGNOSIS — M25551 Pain in right hip: Secondary | ICD-10-CM | POA: Diagnosis not present

## 2023-11-25 DIAGNOSIS — M5459 Other low back pain: Secondary | ICD-10-CM | POA: Diagnosis not present

## 2023-12-06 ENCOUNTER — Encounter: Payer: Self-pay | Admitting: Radiology

## 2023-12-21 DIAGNOSIS — M5459 Other low back pain: Secondary | ICD-10-CM | POA: Diagnosis not present

## 2023-12-21 DIAGNOSIS — M25551 Pain in right hip: Secondary | ICD-10-CM | POA: Diagnosis not present

## 2023-12-21 DIAGNOSIS — M25522 Pain in left elbow: Secondary | ICD-10-CM | POA: Diagnosis not present

## 2023-12-23 DIAGNOSIS — M25522 Pain in left elbow: Secondary | ICD-10-CM | POA: Diagnosis not present

## 2023-12-23 DIAGNOSIS — M25551 Pain in right hip: Secondary | ICD-10-CM | POA: Diagnosis not present

## 2023-12-23 DIAGNOSIS — M5459 Other low back pain: Secondary | ICD-10-CM | POA: Diagnosis not present

## 2024-05-30 ENCOUNTER — Ambulatory Visit
